# Patient Record
Sex: Female | Born: 1940 | Race: White | Hispanic: No | Marital: Married | Smoking: Never smoker
Health system: Southern US, Community
[De-identification: ages and names within clinical notes are randomized; demographics above are authoritative.]

## PROBLEM LIST (undated history)

## (undated) DIAGNOSIS — I10 Essential (primary) hypertension: Secondary | ICD-10-CM

## (undated) DIAGNOSIS — E785 Hyperlipidemia, unspecified: Secondary | ICD-10-CM

## (undated) HISTORY — PX: KNEE SURGERY: SHX244

## (undated) HISTORY — PX: EYE SURGERY: SHX253

## (undated) HISTORY — PX: TUBAL LIGATION: SHX77

---

## 2021-01-04 DIAGNOSIS — U099 Post covid-19 condition, unspecified: Secondary | ICD-10-CM | POA: Diagnosis not present

## 2021-01-04 DIAGNOSIS — J9621 Acute and chronic respiratory failure with hypoxia: Secondary | ICD-10-CM

## 2021-01-04 DIAGNOSIS — R1312 Dysphagia, oropharyngeal phase: Secondary | ICD-10-CM

## 2021-01-04 DIAGNOSIS — E871 Hypo-osmolality and hyponatremia: Secondary | ICD-10-CM | POA: Diagnosis not present

## 2021-01-05 DIAGNOSIS — U099 Post covid-19 condition, unspecified: Secondary | ICD-10-CM

## 2021-01-05 DIAGNOSIS — E871 Hypo-osmolality and hyponatremia: Secondary | ICD-10-CM | POA: Diagnosis not present

## 2021-01-05 DIAGNOSIS — R1312 Dysphagia, oropharyngeal phase: Secondary | ICD-10-CM | POA: Diagnosis not present

## 2021-01-05 DIAGNOSIS — J9621 Acute and chronic respiratory failure with hypoxia: Secondary | ICD-10-CM | POA: Diagnosis not present

## 2021-01-06 DIAGNOSIS — U099 Post covid-19 condition, unspecified: Secondary | ICD-10-CM | POA: Diagnosis not present

## 2021-01-06 DIAGNOSIS — R1312 Dysphagia, oropharyngeal phase: Secondary | ICD-10-CM

## 2021-01-06 DIAGNOSIS — J9621 Acute and chronic respiratory failure with hypoxia: Secondary | ICD-10-CM

## 2021-01-06 DIAGNOSIS — E871 Hypo-osmolality and hyponatremia: Secondary | ICD-10-CM | POA: Diagnosis not present

## 2021-01-07 ENCOUNTER — Inpatient Hospital Stay (HOSPITAL_COMMUNITY)
Admission: EM | Admit: 2021-01-07 | Discharge: 2021-01-15 | DRG: 377 | Disposition: A | Discharge status: Skilled Nursing Facility | Payer: Medicare Other | Source: Other Acute Inpatient Hospital | Attending: Internal Medicine | Admitting: Internal Medicine

## 2021-01-07 ENCOUNTER — Encounter (HOSPITAL_COMMUNITY): Payer: Self-pay | Admitting: Student in an Organized Health Care Education/Training Program

## 2021-01-07 DIAGNOSIS — J9600 Acute respiratory failure, unspecified whether with hypoxia or hypercapnia: Secondary | ICD-10-CM | POA: Diagnosis not present

## 2021-01-07 DIAGNOSIS — J9621 Acute and chronic respiratory failure with hypoxia: Secondary | ICD-10-CM

## 2021-01-07 DIAGNOSIS — G4733 Obstructive sleep apnea (adult) (pediatric): Secondary | ICD-10-CM | POA: Diagnosis not present

## 2021-01-07 DIAGNOSIS — R1319 Other dysphagia: Secondary | ICD-10-CM | POA: Diagnosis not present

## 2021-01-07 DIAGNOSIS — Z7982 Long term (current) use of aspirin: Secondary | ICD-10-CM

## 2021-01-07 DIAGNOSIS — R32 Unspecified urinary incontinence: Secondary | ICD-10-CM | POA: Diagnosis present

## 2021-01-07 DIAGNOSIS — D62 Acute posthemorrhagic anemia: Secondary | ICD-10-CM | POA: Diagnosis present

## 2021-01-07 DIAGNOSIS — J90 Pleural effusion, not elsewhere classified: Secondary | ICD-10-CM | POA: Diagnosis present

## 2021-01-07 DIAGNOSIS — K5641 Fecal impaction: Secondary | ICD-10-CM

## 2021-01-07 DIAGNOSIS — R1312 Dysphagia, oropharyngeal phase: Secondary | ICD-10-CM

## 2021-01-07 DIAGNOSIS — K5721 Diverticulitis of large intestine with perforation and abscess with bleeding: Secondary | ICD-10-CM | POA: Diagnosis present

## 2021-01-07 DIAGNOSIS — D75839 Thrombocytosis, unspecified: Secondary | ICD-10-CM | POA: Diagnosis present

## 2021-01-07 DIAGNOSIS — D638 Anemia in other chronic diseases classified elsewhere: Secondary | ICD-10-CM | POA: Diagnosis present

## 2021-01-07 DIAGNOSIS — I119 Hypertensive heart disease without heart failure: Secondary | ICD-10-CM | POA: Diagnosis present

## 2021-01-07 DIAGNOSIS — R5381 Other malaise: Secondary | ICD-10-CM | POA: Diagnosis present

## 2021-01-07 DIAGNOSIS — R935 Abnormal findings on diagnostic imaging of other abdominal regions, including retroperitoneum: Secondary | ICD-10-CM

## 2021-01-07 DIAGNOSIS — I10 Essential (primary) hypertension: Secondary | ICD-10-CM | POA: Diagnosis present

## 2021-01-07 DIAGNOSIS — E785 Hyperlipidemia, unspecified: Secondary | ICD-10-CM | POA: Diagnosis present

## 2021-01-07 DIAGNOSIS — Z7952 Long term (current) use of systemic steroids: Secondary | ICD-10-CM | POA: Diagnosis not present

## 2021-01-07 DIAGNOSIS — E871 Hypo-osmolality and hyponatremia: Secondary | ICD-10-CM

## 2021-01-07 DIAGNOSIS — K921 Melena: Secondary | ICD-10-CM | POA: Diagnosis not present

## 2021-01-07 DIAGNOSIS — K5901 Slow transit constipation: Secondary | ICD-10-CM | POA: Diagnosis not present

## 2021-01-07 DIAGNOSIS — U071 COVID-19: Secondary | ICD-10-CM | POA: Diagnosis not present

## 2021-01-07 DIAGNOSIS — Z20822 Contact with and (suspected) exposure to covid-19: Secondary | ICD-10-CM | POA: Diagnosis present

## 2021-01-07 DIAGNOSIS — K922 Gastrointestinal hemorrhage, unspecified: Secondary | ICD-10-CM | POA: Diagnosis present

## 2021-01-07 DIAGNOSIS — U099 Post covid-19 condition, unspecified: Secondary | ICD-10-CM

## 2021-01-07 DIAGNOSIS — R1032 Left lower quadrant pain: Secondary | ICD-10-CM | POA: Diagnosis not present

## 2021-01-07 DIAGNOSIS — Z79899 Other long term (current) drug therapy: Secondary | ICD-10-CM

## 2021-01-07 DIAGNOSIS — H5462 Unqualified visual loss, left eye, normal vision right eye: Secondary | ICD-10-CM | POA: Diagnosis present

## 2021-01-07 DIAGNOSIS — K219 Gastro-esophageal reflux disease without esophagitis: Secondary | ICD-10-CM | POA: Diagnosis present

## 2021-01-07 DIAGNOSIS — H9190 Unspecified hearing loss, unspecified ear: Secondary | ICD-10-CM | POA: Diagnosis present

## 2021-01-07 DIAGNOSIS — K5732 Diverticulitis of large intestine without perforation or abscess without bleeding: Secondary | ICD-10-CM | POA: Diagnosis not present

## 2021-01-07 DIAGNOSIS — Z9981 Dependence on supplemental oxygen: Secondary | ICD-10-CM

## 2021-01-07 DIAGNOSIS — K626 Ulcer of anus and rectum: Secondary | ICD-10-CM | POA: Diagnosis present

## 2021-01-07 DIAGNOSIS — K5900 Constipation, unspecified: Secondary | ICD-10-CM | POA: Diagnosis not present

## 2021-01-07 DIAGNOSIS — Z86010 Personal history of colonic polyps: Secondary | ICD-10-CM

## 2021-01-07 DIAGNOSIS — K5792 Diverticulitis of intestine, part unspecified, without perforation or abscess without bleeding: Secondary | ICD-10-CM | POA: Diagnosis not present

## 2021-01-07 DIAGNOSIS — R339 Retention of urine, unspecified: Secondary | ICD-10-CM | POA: Diagnosis not present

## 2021-01-07 DIAGNOSIS — T380X5A Adverse effect of glucocorticoids and synthetic analogues, initial encounter: Secondary | ICD-10-CM | POA: Diagnosis present

## 2021-01-07 DIAGNOSIS — Z22322 Carrier or suspected carrier of Methicillin resistant Staphylococcus aureus: Secondary | ICD-10-CM

## 2021-01-07 HISTORY — DX: Hyperlipidemia, unspecified: E78.5

## 2021-01-07 HISTORY — DX: Essential (primary) hypertension: I10

## 2021-01-07 LAB — CBC WITH DIFFERENTIAL/PLATELET
Abs Immature Granulocytes: 0.66 10*3/uL — ABNORMAL HIGH (ref 0.00–0.07)
Basophils Absolute: 0 10*3/uL (ref 0.0–0.1)
Basophils Relative: 0 %
Eosinophils Absolute: 0.1 10*3/uL (ref 0.0–0.5)
Eosinophils Relative: 0 %
HCT: 22.6 % — ABNORMAL LOW (ref 36.0–46.0)
Hemoglobin: 7.2 g/dL — ABNORMAL LOW (ref 12.0–15.0)
Immature Granulocytes: 4 %
Lymphocytes Relative: 11 %
Lymphs Abs: 1.8 10*3/uL (ref 0.7–4.0)
MCH: 29.5 pg (ref 26.0–34.0)
MCHC: 31.9 g/dL (ref 30.0–36.0)
MCV: 92.6 fL (ref 80.0–100.0)
Monocytes Absolute: 1 10*3/uL (ref 0.1–1.0)
Monocytes Relative: 7 %
Neutro Abs: 12.2 10*3/uL — ABNORMAL HIGH (ref 1.7–7.7)
Neutrophils Relative %: 78 %
Platelets: 517 10*3/uL — ABNORMAL HIGH (ref 150–400)
RBC: 2.44 MIL/uL — ABNORMAL LOW (ref 3.87–5.11)
RDW: 16 % — ABNORMAL HIGH (ref 11.5–15.5)
WBC: 15.8 10*3/uL — ABNORMAL HIGH (ref 4.0–10.5)
nRBC: 0.1 % (ref 0.0–0.2)

## 2021-01-07 LAB — COMPREHENSIVE METABOLIC PANEL
ALT: 40 U/L (ref 0–44)
AST: 19 U/L (ref 15–41)
Albumin: 2.1 g/dL — ABNORMAL LOW (ref 3.5–5.0)
Alkaline Phosphatase: 61 U/L (ref 38–126)
Anion gap: 6 (ref 5–15)
BUN: 17 mg/dL (ref 8–23)
CO2: 29 mmol/L (ref 22–32)
Calcium: 8.6 mg/dL — ABNORMAL LOW (ref 8.9–10.3)
Chloride: 97 mmol/L — ABNORMAL LOW (ref 98–111)
Creatinine, Ser: 0.42 mg/dL — ABNORMAL LOW (ref 0.44–1.00)
GFR, Estimated: >60 mL/min (ref >60)
Glucose, Bld: 107 mg/dL — ABNORMAL HIGH (ref 70–99)
Potassium: 4.2 mmol/L (ref 3.5–5.1)
Sodium: 132 mmol/L — ABNORMAL LOW (ref 135–145)
Total Bilirubin: 0.6 mg/dL (ref 0.3–1.2)
Total Protein: 4.4 g/dL — ABNORMAL LOW (ref 6.5–8.1)

## 2021-01-07 LAB — PREPARE RBC (CROSSMATCH)

## 2021-01-07 LAB — ABO/RH: ABO/RH(D): O POS

## 2021-01-07 LAB — APTT: aPTT: 28 seconds (ref 24–36)

## 2021-01-07 LAB — LACTIC ACID, PLASMA: Lactic Acid, Venous: 1.5 mmol/L (ref 0.5–1.9)

## 2021-01-07 LAB — PROTIME-INR
INR: 1 (ref 0.8–1.2)
Prothrombin Time: 13.2 seconds (ref 11.4–15.2)

## 2021-01-07 MED ORDER — SODIUM CHLORIDE 0.9% IV SOLUTION: Freq: Once | INTRAVENOUS | Status: AC

## 2021-01-07 NOTE — ED Provider Notes (Incomplete)
Patient is a 80 year old female with multiple medical problems including recent COVID and hospitalization now at an LTAC for oxygen weaning.  She is still on 40% FiO2 and continues to be weaned down but today had acute dark blood per rectum.  Patient continued to have multiple bloody bowel movements and had a hemoglobin drop from 9-7 with increase in her heart rate but stable hemoglobin.  She is not on anticoagulation however the LTAC facility does not have any blood available to give the patient if she continues to bleed.  They requested transfer here.  Here patient is pale but is having no abdominal pain at this time.  Lungs clear and murmur noted on heart exam. She has dark blood present on the pad in her bed appearing to be from the rectum.  Oxygen remained stable and she is hemodynamically stable.  However given excessive amount of bleeding and hemoglobin drop will transfuse 2 units, admit and have evaluation by GI.  She has had no prior GI bleeding in the past.  She was given prophylactic Lovenox while being at the facility but was not receiving therapeutic doses.  CRITICAL CARE Performed by: Estefanny Moler Total critical care time: 30 minutes Critical care time was exclusive of separately billable procedures and treating other patients. Critical care was necessary to treat or prevent imminent or life-threatening deterioration. Critical care was time spent personally by me on the following activities: development of treatment plan with patient and/or surrogate as well as nursing, discussions with consultants, evaluation of patient's response to treatment, examination of patient, obtaining history from patient or surrogate, ordering and performing treatments and interventions, ordering and review of laboratory studies, ordering and review of radiographic studies, pulse oximetry and re-evaluation of patient's condition.  Marland Kitchen

## 2021-01-07 NOTE — ED Provider Notes (Signed)
[facility] EMERGENCY DEPARTMENT Provider Note   CSN: [id number] Arrival date & time: 01/07/21  2133     History Chief Complaint  Patient presents with  . GI Bleeding    Joanne Hardin is a 80 y.o. female.  The history is provided by the patient and the EMS personnel.  Rectal Bleeding Quality:  Maroon Amount:  Moderate Duration:  1 day Timing:  Intermittent Chronicity:  Recurrent Context: spontaneously   Relieved by:  Nothing Worsened by:  Nothing Ineffective treatments:  None tried Associated symptoms: abdominal pain   Associated symptoms: no fever and no vomiting   Risk factors: steroid use        History reviewed. No pertinent past medical history.  There are no problems to display for this patient.   History reviewed. No pertinent surgical history.   OB History   No obstetric history on file.     History reviewed. No pertinent family history.     Home Medications Prior to Admission medications   Not on File    Allergies    Patient has no allergy information on record.  Review of Systems   Review of Systems  Constitutional: Negative for chills and fever.  HENT: Negative for ear pain and sore throat.   Eyes: Negative for pain and visual disturbance.  Respiratory: Negative for cough and shortness of breath.   Cardiovascular: Negative for chest pain and palpitations.  Gastrointestinal: Positive for abdominal pain, blood in stool and hematochezia. Negative for vomiting.  Genitourinary: Negative for dysuria and hematuria.  Musculoskeletal: Negative for arthralgias and back pain.  Skin: Negative for color change and rash.  Neurological: Negative for seizures and syncope.  All other systems reviewed and are negative.   Physical Exam Updated Vital Signs BP 121/60   Pulse 98   Temp 98.2 F (36.8 C) (Oral)   Resp 19   Ht 5\' 4"  (1.626 m)   Wt 77.1 kg   SpO2 100%   BMI 29.18 kg/m   Physical Exam Vitals and nursing note  reviewed.  Constitutional:      General: She is not in acute distress.    Appearance: She is well-developed and well-nourished.  HENT:     Head: Normocephalic and atraumatic.  Eyes:     Conjunctiva/sclera: Conjunctivae normal.  Cardiovascular:     Rate and Rhythm: Regular rhythm. Tachycardia present.     Heart sounds: No murmur heard.   Pulmonary:     Effort: Pulmonary effort is normal. No respiratory distress.     Breath sounds: Normal breath sounds.  Abdominal:     Palpations: Abdomen is soft.     Tenderness: There is no abdominal tenderness.  Genitourinary:    Rectum: Guaiac result positive.     Comments: melana Musculoskeletal:        General: No edema.     Cervical back: Neck supple.  Skin:    General: Skin is warm and dry.     Coloration: Skin is pale.  Neurological:     Mental Status: She is alert.  Psychiatric:        Mood and Affect: Mood and affect normal.     ED Results / Procedures / Treatments   Labs (all labs ordered are listed, but only abnormal results are displayed) Labs Reviewed  COMPREHENSIVE METABOLIC PANEL - Abnormal; Notable for the following components:      Result Value   Sodium 132 (*)    Chloride 97 (*)    Glucose,  Bld 107 (*)    Creatinine, Ser 0.42 (*)    Calcium 8.6 (*)    Total Protein 4.4 (*)    Albumin 2.1 (*)    All other components within normal limits  CBC WITH DIFFERENTIAL/PLATELET - Abnormal; Notable for the following components:   WBC 15.8 (*)    RBC 2.44 (*)    Hemoglobin 7.2 (*)    HCT 22.6 (*)    RDW 16.0 (*)    Platelets 517 (*)    Neutro Abs 12.2 (*)    Abs Immature Granulocytes 0.66 (*)    All other components within normal limits  PROTIME-INR  LACTIC ACID, PLASMA  APTT  TYPE AND SCREEN  ABO/RH  PREPARE RBC (CROSSMATCH)    EKG None  Radiology No results found.  Procedures Procedures (including critical care time)  Medications Ordered in ED Medications  0.9 %  sodium chloride infusion (Manually  program via Guardrails IV Fluids) (has no administration in time range)    ED Course  I have reviewed the triage vital signs and the nursing notes.  Pertinent labs & imaging results that were available during my care of the patient were reviewed by me and considered in my medical decision making (see chart for details).    MDM Rules/Calculators/A&P                          This is a 80 year old female with a past medical history of hypoxic respiratory failure secondary to COVID-pneumonia, hyponatremia (resolved), hypertension, baseline debility, who presents emergency department for evaluation of lower GI bleed.  Patient was recently admitted to the hospital on 12/10/2020, for COVID-pneumonia in which she required high flow nasal cannula in between BiPAP and was treated with remdesivir as well as IV steroids.  Her oxygen was weaned down to high flow at 75% and she was able to be discharged to a skilled nursing facility.  At the skilled nursing facility she did improve from a respiratory and oxygenation status to where she was able to be weaned down from 70% to 40% over the last few days.  Unfortunately this morning she sustained a lower GI bleed that was described at the facility as maroon blood from the rectum.  The patient was given Protonix and transferred to the emergency department for further evaluation. Of note on reviewing previous records, the patient appears to have had a hemoglobin drop from 9.4 to 7.1 from 1/19 until this afternoon.  On arrival she is afebrile, mildly tachycardic with heart rate in the low 100s which when reviewing previous records has been her baseline since her discharge from the hospital, she is hemodynamically stable and normotensive.  She reported that she was having some lower abdominal pain earlier in the day, that is now resolved, she has a nontender abdomen on my exam.  She has gross maroon melanotic blood per rectum.  Of note the patient was taken off high flow  on arrival as she was waiting for room placed on a nonrebreather.  Both on high flow nasal cannula at 40% as well as nonrebreather her saturations have been maintained at 100%.  Patient was cross and transfuse 2 units for a confirmed hemoglobin that was 7.2 on her arrival here.  Chemistry is significant for mild hyponatremia 132 and an albumin of 2.1. Coags are within normal limits, of note the patient was being treated with prophylactic Lovenox while the facility that was held this morning. Her CBC  is significant for mild leukocytosis of 15.8 which is likely secondary to the steroids that she was receiving for her COVID diagnosis.  Her hemoglobin is 7.2 which appears to be stable from the 7.1 at the facility reported, however given the ongoing bleeding on my exam she was transfused 2 units. Lactic acid is within normal limits, given the normal lactic acid and the patient not have any abdominal pain, do not believe that this is secondary to ischemic colitis and more likely related to, causes lower GI bleed such as diverticular bleeding, polyps, or potential consequence of IV steroids causing brisk upper GI gastric mucosal bleed.  Patient will be admitted to the hospital for hemoglobin trending, response evaluation, and a secure chat was sent to gastroenterology for consultation tomorrow.  Final Clinical Impression(s) / ED Diagnoses Final diagnoses:  Lower GI bleed    Rx / DC Orders ED Discharge Orders    None       Kathleen Lime, MD 01/07/21 2637    Gwyneth Sprout, MD 01/08/21 Corky Crafts

## 2021-01-07 NOTE — ED Triage Notes (Signed)
Pt came via [facility] with complaints of GI bleed. [facility] reports pts hemoglobin went from 9.1 to 7.1. Pt has had bloody stools at 5:35 this afternoon according to [facility]. Pt is reporting lower abdominal pains. [facility] reports pts vitals have been stable bp 135/60, hr 108, rr 22. Pt has a urinary catheter and peg tube.

## 2021-01-08 ENCOUNTER — Encounter (HOSPITAL_COMMUNITY): Payer: Self-pay | Admitting: Internal Medicine

## 2021-01-08 ENCOUNTER — Encounter (HOSPITAL_COMMUNITY)
Admission: EM | Disposition: A | Discharge status: Skilled Nursing Facility | Payer: Self-pay | Source: Other Acute Inpatient Hospital | Attending: Internal Medicine

## 2021-01-08 ENCOUNTER — Inpatient Hospital Stay (HOSPITAL_COMMUNITY): Payer: Medicare Other

## 2021-01-08 DIAGNOSIS — K5900 Constipation, unspecified: Secondary | ICD-10-CM | POA: Diagnosis not present

## 2021-01-08 DIAGNOSIS — K921 Melena: Secondary | ICD-10-CM | POA: Diagnosis not present

## 2021-01-08 DIAGNOSIS — D62 Acute posthemorrhagic anemia: Secondary | ICD-10-CM

## 2021-01-08 DIAGNOSIS — U071 COVID-19: Secondary | ICD-10-CM

## 2021-01-08 DIAGNOSIS — R1032 Left lower quadrant pain: Secondary | ICD-10-CM | POA: Diagnosis not present

## 2021-01-08 DIAGNOSIS — I10 Essential (primary) hypertension: Secondary | ICD-10-CM | POA: Diagnosis present

## 2021-01-08 DIAGNOSIS — J9600 Acute respiratory failure, unspecified whether with hypoxia or hypercapnia: Secondary | ICD-10-CM

## 2021-01-08 LAB — MRSA PCR SCREENING: MRSA by PCR: POSITIVE — AB

## 2021-01-08 LAB — BPAM RBC
Blood Product Expiration Date: 202202212359
ISSUE DATE / TIME: 202201202335
Unit Type and Rh: 5100

## 2021-01-08 LAB — BASIC METABOLIC PANEL
Anion gap: 8 (ref 5–15)
BUN: 16 mg/dL (ref 8–23)
CO2: 31 mmol/L (ref 22–32)
Calcium: 9 mg/dL (ref 8.9–10.3)
Chloride: 95 mmol/L — ABNORMAL LOW (ref 98–111)
Creatinine, Ser: 0.48 mg/dL (ref 0.44–1.00)
GFR, Estimated: >60 mL/min (ref >60)
Glucose, Bld: 95 mg/dL (ref 70–99)
Potassium: 4.1 mmol/L (ref 3.5–5.1)
Sodium: 134 mmol/L — ABNORMAL LOW (ref 135–145)

## 2021-01-08 LAB — TYPE AND SCREEN
ABO/RH(D): O POS
Antibody Screen: NEGATIVE
Unit division: 0

## 2021-01-08 LAB — PROTIME-INR
INR: 0.9 (ref 0.8–1.2)
Prothrombin Time: 12.1 seconds (ref 11.4–15.2)

## 2021-01-08 LAB — SARS CORONAVIRUS 2 BY RT PCR (HOSPITAL ORDER, PERFORMED IN ~~LOC~~ HOSPITAL LAB): SARS Coronavirus 2: NEGATIVE

## 2021-01-08 LAB — CBC
HCT: 27.9 % — ABNORMAL LOW (ref 36.0–46.0)
Hemoglobin: 9.6 g/dL — ABNORMAL LOW (ref 12.0–15.0)
MCH: 31.4 pg (ref 26.0–34.0)
MCHC: 34.4 g/dL (ref 30.0–36.0)
MCV: 91.2 fL (ref 80.0–100.0)
Platelets: 537 10*3/uL — ABNORMAL HIGH (ref 150–400)
RBC: 3.06 MIL/uL — ABNORMAL LOW (ref 3.87–5.11)
RDW: 15.8 % — ABNORMAL HIGH (ref 11.5–15.5)
WBC: 14.8 10*3/uL — ABNORMAL HIGH (ref 4.0–10.5)
nRBC: 0.1 % (ref 0.0–0.2)

## 2021-01-08 SURGERY — ESOPHAGOGASTRODUODENOSCOPY (EGD) WITH PROPOFOL
Anesthesia: Monitor Anesthesia Care

## 2021-01-08 MED ORDER — PANTOPRAZOLE SODIUM 40 MG IV SOLR: 40.0000 mg | Freq: Two times a day (BID) | INTRAVENOUS | Status: DC

## 2021-01-08 MED ORDER — SODIUM CHLORIDE 0.9 % IV SOLN
8.0000 mg/h | INTRAVENOUS | Status: DC
  Administered 2021-01-08: 8 mg/h via INTRAVENOUS
  Filled 2021-01-08 (×2): qty 80

## 2021-01-08 MED ORDER — SODIUM CHLORIDE 0.9 % IV SOLN
80.0000 mg | Freq: Once | INTRAVENOUS | Status: AC
  Administered 2021-01-08: 80 mg via INTRAVENOUS
  Filled 2021-01-08: qty 80

## 2021-01-08 MED ORDER — IOHEXOL 9 MG/ML PO SOLN
500.0000 mL | ORAL | Status: AC
  Administered 2021-01-08: 500 mL via ORAL

## 2021-01-08 MED ORDER — POLYETHYLENE GLYCOL 3350 17 GM/SCOOP PO POWD
1.0000 | Freq: Once | ORAL | Status: AC
  Administered 2021-01-08: 255 g via ORAL
  Filled 2021-01-08: qty 255

## 2021-01-08 MED ORDER — DIPHENHYDRAMINE-ZINC ACETATE 2-0.1 % EX CREA
TOPICAL_CREAM | Freq: Once | CUTANEOUS | Status: AC
  Administered 2021-01-08: 1 via TOPICAL
  Filled 2021-01-08: qty 28

## 2021-01-08 MED ORDER — METHYLPREDNISOLONE SODIUM SUCC 125 MG IJ SOLR
60.0000 mg | Freq: Every day | INTRAMUSCULAR | Status: DC
  Administered 2021-01-08 – 2021-01-09 (×2): 60 mg via INTRAVENOUS
  Filled 2021-01-08 (×2): qty 2

## 2021-01-08 MED ORDER — IOHEXOL 300 MG/ML  SOLN
100.0000 mL | Freq: Once | INTRAMUSCULAR | Status: AC | PRN
  Administered 2021-01-08: 100 mL via INTRAVENOUS

## 2021-01-08 MED ORDER — SODIUM CHLORIDE 0.9 % IV SOLN
1.0000 g | INTRAVENOUS | Status: DC
  Administered 2021-01-08 – 2021-01-13 (×6): 1 g via INTRAVENOUS
  Filled 2021-01-08 (×2): qty 1
  Filled 2021-01-08 (×3): qty 10
  Filled 2021-01-08 (×2): qty 1

## 2021-01-08 MED ORDER — METOPROLOL SUCCINATE ER 25 MG PO TB24
25.0000 mg | ORAL_TABLET | Freq: Every day | ORAL | Status: DC
  Administered 2021-01-08 – 2021-01-14 (×7): 25 mg via ORAL
  Filled 2021-01-08 (×8): qty 1

## 2021-01-08 MED ORDER — MUPIROCIN 2 % EX OINT
1.0000 "application " | TOPICAL_OINTMENT | Freq: Two times a day (BID) | CUTANEOUS | Status: AC
  Administered 2021-01-09 – 2021-01-13 (×9): 1 via NASAL
  Filled 2021-01-08 (×2): qty 22

## 2021-01-08 MED ORDER — METRONIDAZOLE IN NACL 5-0.79 MG/ML-% IV SOLN
500.0000 mg | Freq: Three times a day (TID) | INTRAVENOUS | Status: DC
  Administered 2021-01-08 – 2021-01-14 (×17): 500 mg via INTRAVENOUS
  Filled 2021-01-08 (×15): qty 100

## 2021-01-08 MED ORDER — CHLORHEXIDINE GLUCONATE CLOTH 2 % EX PADS
6.0000 | MEDICATED_PAD | Freq: Every day | CUTANEOUS | Status: AC
  Administered 2021-01-09 – 2021-01-11 (×3): 6 via TOPICAL

## 2021-01-08 MED ORDER — IOHEXOL 9 MG/ML PO SOLN
ORAL | Status: AC
  Filled 2021-01-08: qty 1000

## 2021-01-08 MED ORDER — PANTOPRAZOLE SODIUM 40 MG IV SOLR
40.0000 mg | INTRAVENOUS | Status: DC
  Administered 2021-01-09: 40 mg via INTRAVENOUS
  Filled 2021-01-08: qty 40

## 2021-01-08 MED ORDER — HYDRALAZINE HCL 20 MG/ML IJ SOLN: 10.0000 mg | INTRAMUSCULAR | Status: DC | PRN

## 2021-01-08 MED ORDER — PANTOPRAZOLE SODIUM 40 MG IV SOLR: 40.0000 mg | INTRAVENOUS | Status: DC

## 2021-01-08 MED ORDER — ACETAMINOPHEN 650 MG RE SUPP: 650.0000 mg | Freq: Four times a day (QID) | RECTAL | Status: DC | PRN

## 2021-01-08 MED ORDER — ACETAMINOPHEN 325 MG PO TABS
650.0000 mg | ORAL_TABLET | Freq: Four times a day (QID) | ORAL | Status: DC | PRN
  Administered 2021-01-10: 650 mg via ORAL
  Filled 2021-01-08 (×2): qty 2

## 2021-01-08 MED ORDER — SODIUM CHLORIDE 0.9 % IV SOLN: 1.0000 g | INTRAVENOUS | Status: DC

## 2021-01-08 NOTE — ED Notes (Signed)
Pt arrived on [facility] via [facility] from [facility]. Pt was placed on NRB 15Lpm and HFNC Salter 15 Lpm. Throughout the night O2 has been titrated down. Pt now on HFNC Salter 4Lpm. Pts respiratory status is stable w/no distress noted at this time. RT will continue to monitor.

## 2021-01-08 NOTE — Consult Note (Addendum)
[facility] Consult: 8:48 AM 01/08/2021  LOS: 1 day    Referring Provider: Dr Isidoro Donning  Primary Care Physician:    Currently residing at [facility].   Primary Gastroenterologist:  Joanne Hardin.  Joanne Kurstedt PA-C in [place]. Joanne Raisin, MD in [facility]    Reason for Consultation: GI bleed.   HPI: Joanne Hardin is a 80 y.o. female.  PMH hypertension.  Hyperlipidemia.  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 Colonoscopy ~ 05/2019.  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  Admitted to hospital in [place] in December for COVID 19 (unvaccinated), discharged to [facility] after difficulty weaning off high flow oxygen.  Problems have included urinary incontinence and some urinary retention and she currently has Foley catheter in place.  Chronic dysphagia.  At home she would eat soft easy to chew foods but had never had esophageal dilatation.  At [facility] she is on a chopped diet and her appetite is good.  She cannot tell me her bowel habits so its not clear if she has been constipated lately.  She does say she had black stools on Wednesday but cannot tell me if they recurred or how often they occurred.  Hematochezia, maroon stool reported last night and patient was having epigastric pain.  No nausea, no vomiting. Hgb declined 9.1-7.1. Per EMS stable BP 135/60, HR 108.  PPI drip initiated.  Hgb 9.6 after 1 PRBC.   Hyponatremia 132.  No renal insufficiency.  Normal lactic acid level.  Normal INR. Initially receiving high flow oxygen via nonrebreather mask, oxygen flow has gone from 15 L to 4 L/min and respiratory wise is in no distress. BPs 120s to 130s/50s.  Heart rates in the low 100s.  No fever.  Oxygen saturation upper 90s to  100%.  Outpatient meds include 81 ASA, no PPI or H2 blocker.  Patient denies previous history of GI bleeds.  She does recall previous EGD.  She is not sure if at her latest colonoscopy polyps were removed but she was advised to have another colonoscopy in 5 years.  No NSAIDs.  Patient's lost more than 100 pounds in the last couple of years and is probably lost 20 pounds since the COVID infection.  No previous blood transfusions or issues with bleeding.  She has been receiving daily abdominal injections of what sounds like either Lovenox or subcu heparin but neither of these are listed on her med list.  Her paper chart does not include a med list but a med list has been generated in epic and includes Protonix 40 mg bid.  Patient says that at home for reflux she takes Pepcid nightly.  A few days ago at the [facility], a provider was checking her gag reflex and patient vomited nonbloody stool.  Otherwise has not had any vomiting and never nausea.  Past Medical History:  Diagnosis Date  . HLD (hyperlipidemia)   . Hypertension     Past Surgical History:  Procedure Laterality Date  . EYE SURGERY    .  KNEE SURGERY    . TUBAL LIGATION      Prior to Admission medications   Medication Sig Start Date End Date Taking? Authorizing Provider  acetaminophen (TYLENOL) 650 MG CR tablet Take 650 mg by mouth every 6 (six) hours as needed for pain.   Yes [provider]  ALPRAZolam (XANAX) 0.25 MG tablet Take 0.25 mg by mouth every 4 (four) hours as needed for anxiety.   Yes [provider]  amLODipine (NORVASC) 10 MG tablet Take 10 mg by mouth daily. 11/02/20  Yes [provider]  aspirin 81 MG chewable tablet Chew 81 mg by mouth daily.   Yes [provider]  benzonatate (TESSALON) 100 MG capsule Take by mouth every 8 (eight) hours as needed for cough.   Yes [provider]  Cholecalciferol (VITAMIN D) 125 MCG (5000 UT) CAPS Take 5,000 Units by mouth daily.   Yes  [provider]  docusate sodium (COLACE) 100 MG capsule Take 100 mg by mouth in the morning and at bedtime.   Yes [provider]  guaifenesin (HUMIBID E) 400 MG TABS tablet Take 400 mg by mouth every 6 (six) hours as needed (cough).   Yes [provider]  LACTOBACILLUS PO Take 1 tablet by mouth in the morning and at bedtime.   Yes [provider]  losartan (COZAAR) 100 MG tablet Take 100 mg by mouth daily.   Yes [provider]  melatonin 3 MG TABS tablet Take 3 mg by mouth at bedtime.   Yes [provider]  metoprolol succinate (TOPROL-XL) 25 MG 24 hr tablet Take 25 mg by mouth daily. 01/05/21  Yes [provider]  Multiple Vitamin (MULTIVITAMIN) tablet Take 1 tablet by mouth daily.   Yes [provider]  Nutritional Supplements (ENSURE ENLIVE PO) Take 237 mLs by mouth 2 (two) times daily with a meal.   Yes [provider]  ondansetron (ZOFRAN) 4 MG tablet Take 4 mg by mouth every 8 (eight) hours as needed for nausea or vomiting.   Yes [provider]  pantoprazole (PROTONIX) 40 MG tablet Take 40 mg by mouth 2 (two) times daily.   Yes [provider]  predniSONE (DELTASONE) 10 MG tablet Take 10 mg by mouth See admin instructions. 30 mg qd x 3 days, 20mg  qd x 3 days,10mg  qd x 3 days   Yes [provider]  sennosides-docusate sodium (SENOKOT-S) 8.6-50 MG tablet Take 2 tablets by mouth daily.   Yes [provider]  tamsulosin (FLOMAX) 0.4 MG CAPS capsule Take 0.4 mg by mouth daily.   Yes [provider]  enoxaparin (LOVENOX) 80 MG/0.8ML injection Inject 70 mg into the skin every 12 (twelve) hours. Patient not taking: No sig reported    [provider]  methylPREDNISolone (MEDROL) 16 MG tablet Take 60 mg by mouth daily. Patient not taking: No sig reported    [provider]  thiamine 100 MG tablet Take 100 mg by mouth daily. Patient not taking: Reported on  01/08/2021    [provider]  vitamin C (ASCORBIC ACID) 500 MG tablet Take 1,000 mg by mouth 2 (two) times daily. Patient not taking: No sig reported    [provider]  zinc sulfate 220 (50 Zn) MG capsule Take 220 mg by mouth daily. Patient not taking: No sig reported    [provider]    Scheduled Meds: . methylPREDNISolone (SOLU-MEDROL) injection  60 mg Intravenous Daily  . metoprolol succinate  25 mg Oral Daily  . [START ON 01/11/2021] pantoprazole  40 mg Intravenous Q12H   Infusions: . pantoprozole (PROTONIX) infusion 8 mg/hr (01/08/21 0557)   PRN Meds: acetaminophen **OR** acetaminophen, hydrALAZINE   Allergies as of 01/07/2021  . (Not on File)    Family History  Problem Relation Age of Onset  . Diabetes Mellitus II Sister     Social History   Socioeconomic History  . Marital status: Married    Spouse name: Not on file  . Number of children: Not on file  . Years of education: Not on file  . Highest education level: Not on file  Occupational History  . Not on file  Tobacco Use  . Smoking status: Never Smoker  . Smokeless tobacco: Never Used  Substance and Sexual Activity  . Alcohol use: Never  . Drug use: Never  . Sexual activity: Not on file  Other Topics Concern  . Not on file  Social History Narrative  . Not on file   Social Determinants of Health   Financial Resource Strain: Not on file  Food Insecurity: Not on file  Transportation Needs: Not on file  Physical Activity: Not on file  Stress: Not on file  Social Connections: Not on file  Intimate Partner Violence: Not on file    REVIEW OF SYSTEMS: Constitutional: Weakness.  Her baseline is being able to stand with maximum assistance.  She is not yet ambulating. ENT:  No nose bleeds.  Nasal congestion.  Extremely hard of hearing Pulm: Still with significant cough as well as nasal congestion CV:  No palpitations, no LE edema.  No angina GU:  No hematuria, no frequency.   Urinary incontinence mostly related to the fact that she cannot get up and pee on her own.  Episodic urinary retention.  Foley catheter in place GI: See HPI Heme: Patient denies history of excessive bleeding or bruising or requirements for iron supplement. Transfusions: No previous blood transfusion Neuro: Blindness in her left eye no headaches, no peripheral tingling or numbness.  No dizziness, no syncope Derm:  No itching, no rash or sores.  Endocrine:  No sweats or chills.  No polyuria or dysuria Immunization: Has not been vaccinated for COVID-19 Travel:  None beyond local counties in last few months.    PHYSICAL EXAM: Vital signs in last 24 hours: Vitals:   01/08/21 0559 01/08/21 0831  BP: (!) 127/54 (!) 133/55  Pulse: (!) 104 (!) 104  Resp: 16 17  Temp: 98.1 F (36.7 C) (!) 97.5 F (36.4 C)  SpO2: 97% 97%   Wt Readings from Last 3 Encounters:  01/07/21 77.1 kg    General: Patient looks ill both chronically and acutely.  Alert, comfortable Head: No facial asymmetry or swelling.  No signs of head trauma. Eyes: Milky left pupil.  No conjunctival pallor Ears: Very hard of hearing, had to pull down my mask so that she could read my lips in addition to talking very loud. Nose: Sniffling in her nose, congested Mouth: Poor dentition.  Mucosa is moist, pink, clear.  Tongue is midline. Neck: No JVD, no masses, no thyromegaly Lungs: Diminished breath sounds.  Positive cough.  Breathing comfortably on 4 L nasal cannula oxygen. Heart: RRR.  No MRG.  S1, S2 present Abdomen: Soft, obese, active bowel sounds.  No HSM, masses, bruits, hernias.  Extensive bruising bilaterally in the lower abdomen moving laterally but not into her back, consistent with some sort of injection. Rectal: Soft large formed ball of  stool in the rectal vault.  Exam finger is covered with pastelike red blood.  No masses palpated. Musc/Skeltl: No joint redness or swelling Extremities: No CCE. Neurologic: Oriented to  year, situation but not good recall of simple details such as how often she is having bowel movements.  Moves all 4 limbs without tremor, strength not tested Skin: No rash, no sores.  Abdominal bruising as per abdominal exam. Tattoos: None Nodes: No cervical adenopathy Psych: Cooperative.  Intake/Output from previous day: 01/20 0701 - 01/21 0700 In: 315 [Blood:315] Out: -  Intake/Output this shift: No intake/output data recorded.  LAB RESULTS: Recent Labs    01/07/21 2148 01/08/21 0615  WBC 15.8* 14.8*  HGB 7.2* 9.6*  HCT 22.6* 27.9*  PLT 517* 537*   BMET Lab Results  Component Value Date   NA 134 (L) 01/08/2021   NA 132 (L) 01/07/2021   K 4.1 01/08/2021   K 4.2 01/07/2021   CL 95 (L) 01/08/2021   CL 97 (L) 01/07/2021   CO2 31 01/08/2021   CO2 29 01/07/2021   GLUCOSE 95 01/08/2021   GLUCOSE 107 (H) 01/07/2021   BUN 16 01/08/2021   BUN 17 01/07/2021   CREATININE 0.48 01/08/2021   CREATININE 0.42 (L) 01/07/2021   CALCIUM 9.0 01/08/2021   CALCIUM 8.6 (L) 01/07/2021   LFT Recent Labs    01/07/21 2148  PROT 4.4*  ALBUMIN 2.1*  AST 19  ALT 40  ALKPHOS 61  BILITOT 0.6   PT/INR Lab Results  Component Value Date   INR 0.9 01/08/2021   INR 1.0 01/07/2021   Hepatitis Panel No results for input(s): HEPBSAG, HCVAB, HEPAIGM, HEPBIGM in the last 72 hours. C-Diff No components found for: CDIFF Lipase  No results found for: LIPASE  Drugs of Abuse  No results found for: LABOPIA, COCAINSCRNUR, LABBENZ, AMPHETMU, THCU, LABBARB   RADIOLOGY STUDIES: No results found.    IMPRESSION:   *Joanne Hardin, bloody stool suggesting lower GI bleed.  Given presence of large stool ball/impaction in the rectum, ? stercoral ulcer. Adenomatous colon polyps around 2017, colonoscopy 05/2019 but unknown if she had recurrent polyps then or if she has diverticulosis AVMs etc.  *   Normocytic anemia, presumed ABL anemia. No comp labs to assess baseline.  Acute blood loss on top of  probable chronic anemia from recent illness.  *    Chronic dysphagia.  Rule out esophageal dysmotility versus stricture.  No previous esophageal dilations.  *   COVID-19 pneumonia hospitalized 11/2020, difficulty weaning off high flow oxygen and transferred to [facility].  *    Debilitated state.  -LLQ pain  PLAN:     *   Will discuss with Dr. Myrtie Neither  *    Gatorade/Miralax "prep" to address fecal impaction.    *   Clears  *   ? Need for PPI drip, leaving in place for now.        Jennye Moccasin  01/08/2021, 8:48 AM Phone [phone]  I have reviewed the entire case in detail with the above APP and discussed the plan in detail.  Therefore, I agree with the diagnoses recorded above. In addition,  I have personally interviewed and examined the patient.  My additional thoughts are as follows:  Thorough chart review performed, medically complex patient at rehab after recent prolonged COVID infection.  Initial reports suggested patient may have had some black stool in the last couple days, but our exam found dark red blood with formed stool/fecal  impaction. I spoke with her son Joanne HuaDavid who was at the bedside, he has not known her to lately be complaining of abdominal pain, and she seems to deny that is near as I can tell (history limited since she is so hard of hearing). I found her to be tender in the left lower quadrant on exam.  She has leukocytosis, though significance of that or relation to the abdominal pain unknown since she also has multiple other physiologic stressors with this chronic illness. However, I am going to obtain abdominal pelvic CT scan with oral and IV contrast to rule out diverticulitis, as that would affect our plans for treatment and eventual colonoscopy.  She will need a sigmoidoscopy or colonoscopy during this hospitalization, but she is not briskly bleeding and we are not yet ready to proceed with that because she needs relief of her obstipation. If she were to  start having melena indicative of overt GI bleed, then EGD would be warranted as well.  These procedures would certainly be at increased risk due to her general debility and overall medical condition.   Joanne Hardin [phone]

## 2021-01-08 NOTE — ED Notes (Signed)
Pt taken off nrb and kept on highflow [place] at 8l

## 2021-01-08 NOTE — Progress Notes (Addendum)
Patient seen and examined, admitted by Dr. Toniann Fail this morning Briefly 80 year old female with hypertension, hyperlipidemia, recent COVID in 11/2020 following which patient was found to difficult to be weaned off high flow oxygen and was subsequently discharged to [facility].  Patient was placed on IV Solu-Medrol for the respiratory failure, found to have melanotic stools and hemoglobin dropped.  Patient was sent to ED. Not on anticoagulation  BP (!) 133/55 (BP Location: Right Arm)   Pulse (!) 104   Temp (!) 97.5 F (36.4 C) (Oral)   Resp 17   Ht 5\' 4"  (1.626 m)   Wt 77.1 kg   SpO2 97%   BMI 29.18 kg/m   Acute GI bleed with ABLA -Continue Protonix, hold aspirin, follow H&H.  Currently no active bleeding. -GI consulted, awaiting recommendations -Hemoglobin 7.2 at the time of admission, transfused 1 unit packed RBCs, 9.6 this morning  Acute respiratory failure secondary to recent COVID-19 infection -Continue IV Solu-Medrol, wean O2 as tolerated  Hypertension -Currently stable, continue IV hydralazine as needed with parameters   will continue to follow   Paige Monarrez M.D.  [facility] 01/08/2021, 9:43 AM

## 2021-01-08 NOTE — ED Notes (Signed)
Pt switched to 4 L [place].

## 2021-01-08 NOTE — H&P (Signed)
History and Physical    Joanne Hardin DJT:[id number] DOB: 20-Oct-1941 DOA: 01/07/2021  PCP: Home, [facility]  Patient coming from: [facility] long-term acute care.  Chief Complaint: Rectal bleeding.  HPI: Joanne Hardin is a 80 y.o. female with history of hypertension and hyperlipidemia was admitted on December 23 for COVID infection following which patient was found to difficult to be weaned off high flow oxygen and was subsequently discharged to [facility] and patient was placed on IV Solu-Medrol for the acute respiratory failure.  Patient was found to have melanotic stool and hemoglobin drop.  Chart showed patient's hemoglobin was around 9 at the time.  Patient was referred to the ER.  Patient at times has abdominal epigastric pain.  Denies any chest pain.  Patient does take aspirin denies taking NSAIDs.  ED Course: In the ER patient hemoglobin is around 9.  Patient was on high flow oxygen.  Patient was admitted for further management of acute GI bleed.  2 units of PRBC transfusion ordered and Protonix drip started.  Review of Systems: As per HPI, rest all negative.   Past Medical History:  Diagnosis Date  . HLD (hyperlipidemia)   . Hypertension     Past Surgical History:  Procedure Laterality Date  . EYE SURGERY    . KNEE SURGERY    . TUBAL LIGATION       reports that she has never smoked. She has never used smokeless tobacco. She reports that she does not drink alcohol and does not use drugs.  No Known Allergies  Family History  Problem Relation Age of Onset  . Diabetes Mellitus II Sister     Prior to Admission medications   Medication Sig Start Date End Date Taking? Authorizing Provider  amLODipine (NORVASC) 10 MG tablet Take 10 mg by mouth daily. 11/02/20   [provider]  aspirin 81 MG chewable tablet Chew 81 mg by mouth daily.    [provider]  atorvastatin (LIPITOR) 20 MG tablet Take 20 mg by mouth daily. 11/15/20   [provider]   docusate sodium (COLACE) 100 MG capsule Take 100 mg by mouth in the morning and at bedtime.    [provider]  hydrochlorothiazide (HYDRODIURIL) 25 MG tablet Take 25 mg by mouth daily. 01/05/21   [provider]  losartan (COZAAR) 100 MG tablet Take 100 mg by mouth daily.    [provider]  metoprolol succinate (TOPROL-XL) 25 MG 24 hr tablet Take 25 mg by mouth daily. 01/05/21   [provider]    Physical Exam: Constitutional: Moderately built and nourished. Vitals:   01/08/21 0045 01/08/21 0100 01/08/21 0115 01/08/21 0130  BP: 112/63 122/66 122/62 (!) 129/53  Pulse: 93 (!) 108 99 94  Resp: 19 (!) 21 (!) 24 15  Temp:      TempSrc:      SpO2: 100% 100% 100% 100%  Weight:      Height:       Eyes: Anicteric no pallor. ENMT: No discharge from the ears eyes nose and mouth. Neck: No mass felt.  No neck rigidity. Respiratory: No rhonchi or crepitations. Cardiovascular: S1-S2 heard. Abdomen: Soft nontender bowel sounds present. Musculoskeletal: No edema. Skin: No rash. Neurologic: Alert awake oriented to time place and person.  Moves all extremities. Psychiatric: Appears normal.  Normal affect.   Labs on Admission: I have personally reviewed following labs and imaging studies  CBC: Recent Labs  Lab 01/07/21 2148  WBC 15.8*  NEUTROABS 12.2*  HGB 7.2*  HCT 22.6*  MCV 92.6  PLT 517*   Basic Metabolic Panel: Recent Labs  Lab 01/07/21 2148  NA 132*  K 4.2  CL 97*  CO2 29  GLUCOSE 107*  BUN 17  CREATININE 0.42*  CALCIUM 8.6*   GFR: Estimated Creatinine Clearance: 59.2 mL/min (A) (by C-G formula based on SCr of 0.42 mg/dL (L)). Liver Function Tests: Recent Labs  Lab 01/07/21 2148  AST 19  ALT 40  ALKPHOS 61  BILITOT 0.6  PROT 4.4*  ALBUMIN 2.1*   No results for input(s): LIPASE, AMYLASE in the last 168 hours. No results for input(s): AMMONIA in the last 168 hours. Coagulation Profile: Recent Labs  Lab 01/07/21 2148   INR 1.0   Cardiac Enzymes: No results for input(s): CKTOTAL, CKMB, CKMBINDEX, TROPONINI in the last 168 hours. BNP (last 3 results) No results for input(s): PROBNP in the last 8760 hours. HbA1C: No results for input(s): HGBA1C in the last 72 hours. CBG: No results for input(s): GLUCAP in the last 168 hours. Lipid Profile: No results for input(s): CHOL, HDL, LDLCALC, TRIG, CHOLHDL, LDLDIRECT in the last 72 hours. Thyroid Function Tests: No results for input(s): TSH, T4TOTAL, FREET4, T3FREE, THYROIDAB in the last 72 hours. Anemia Panel: No results for input(s): VITAMINB12, FOLATE, FERRITIN, TIBC, IRON, RETICCTPCT in the last 72 hours. Urine analysis: No results found for: COLORURINE, APPEARANCEUR, LABSPEC, PHURINE, GLUCOSEU, HGBUR, BILIRUBINUR, KETONESUR, PROTEINUR, UROBILINOGEN, NITRITE, LEUKOCYTESUR Sepsis Labs: @LABRCNTIP (procalcitonin:4,lacticidven:4) )No results found for this or any previous visit (from the past 240 hour(s)).   Radiological Exams on Admission: No results found.    Assessment/Plan Principal Problem:   Acute GI bleeding Active Problems:   Acute respiratory failure due to COVID-19 [facility])   Essential hypertension   Acute blood loss anemia    1. Acute GI bleeding -we will keep patient on Protonix for now.  Hold aspirin 2 units of PRBC transfusion was ordered.  Follow CBC.  [facility] has been sent a message. 2. Acute blood loss anemia follow CBC after transfusion. 3. Acute respiratory failure presently on high flow oxygen secondary to COVID-19 infection recently.  On IV Solu-Medrol. 4. Hypertension we will keep patient n.p.o. IV hydralazine since patient is n.p.o. 5. History of hyperlipidemia.  Since patient has acute GI bleeding with the respiratory failure will need close monitoring for any further worsening in inpatient status.   DVT prophylaxis: SCDs.  Avoiding anticoagulation due to GI bleed. Code Status: Full code. Family Communication: Discussed  with patient. Disposition Plan: Back to facility when stable. Consults called: [facility] was sent a message. Admission status: Inpatient.   [facility] MD [facility] Pager [phone].  If 7PM-7AM, please contact night-coverage [email or web address] Password [email or web address]  01/08/2021, 2:26 AM

## 2021-01-09 DIAGNOSIS — K922 Gastrointestinal hemorrhage, unspecified: Secondary | ICD-10-CM

## 2021-01-09 DIAGNOSIS — K5900 Constipation, unspecified: Secondary | ICD-10-CM | POA: Diagnosis not present

## 2021-01-09 DIAGNOSIS — D62 Acute posthemorrhagic anemia: Secondary | ICD-10-CM | POA: Diagnosis not present

## 2021-01-09 DIAGNOSIS — K5792 Diverticulitis of intestine, part unspecified, without perforation or abscess without bleeding: Secondary | ICD-10-CM | POA: Diagnosis not present

## 2021-01-09 DIAGNOSIS — K921 Melena: Secondary | ICD-10-CM | POA: Diagnosis not present

## 2021-01-09 LAB — CBC
HCT: 33.2 % — ABNORMAL LOW (ref 36.0–46.0)
Hemoglobin: 10.7 g/dL — ABNORMAL LOW (ref 12.0–15.0)
MCH: 29.8 pg (ref 26.0–34.0)
MCHC: 32.2 g/dL (ref 30.0–36.0)
MCV: 92.5 fL (ref 80.0–100.0)
Platelets: 578 10*3/uL — ABNORMAL HIGH (ref 150–400)
RBC: 3.59 MIL/uL — ABNORMAL LOW (ref 3.87–5.11)
RDW: 15.9 % — ABNORMAL HIGH (ref 11.5–15.5)
WBC: 15.5 10*3/uL — ABNORMAL HIGH (ref 4.0–10.5)
nRBC: 0 % (ref 0.0–0.2)

## 2021-01-09 LAB — BASIC METABOLIC PANEL
Anion gap: 8 (ref 5–15)
BUN: 10 mg/dL (ref 8–23)
CO2: 28 mmol/L (ref 22–32)
Calcium: 8.8 mg/dL — ABNORMAL LOW (ref 8.9–10.3)
Chloride: 99 mmol/L (ref 98–111)
Creatinine, Ser: 0.5 mg/dL (ref 0.44–1.00)
GFR, Estimated: >60 mL/min (ref >60)
Glucose, Bld: 147 mg/dL — ABNORMAL HIGH (ref 70–99)
Potassium: 4.5 mmol/L (ref 3.5–5.1)
Sodium: 135 mmol/L (ref 135–145)

## 2021-01-09 LAB — GLUCOSE, CAPILLARY
Glucose-Capillary: 76 mg/dL (ref 70–99)
Glucose-Capillary: 87 mg/dL (ref 70–99)

## 2021-01-09 MED ORDER — POLYETHYLENE GLYCOL 3350 17 G PO PACK
17.0000 g | PACK | Freq: Two times a day (BID) | ORAL | Status: DC
  Administered 2021-01-09 – 2021-01-10 (×3): 17 g via ORAL
  Filled 2021-01-09 (×3): qty 1

## 2021-01-09 MED ORDER — BOOST / RESOURCE BREEZE PO LIQD CUSTOM
1.0000 | Freq: Three times a day (TID) | ORAL | Status: DC
  Administered 2021-01-09 – 2021-01-14 (×11): 1 via ORAL

## 2021-01-09 MED ORDER — PANTOPRAZOLE SODIUM 40 MG PO TBEC
40.0000 mg | DELAYED_RELEASE_TABLET | Freq: Every day | ORAL | Status: DC
  Administered 2021-01-10 – 2021-01-15 (×6): 40 mg via ORAL
  Filled 2021-01-09 (×6): qty 1

## 2021-01-09 MED ORDER — PREDNISONE 10 MG PO TABS
10.0000 mg | ORAL_TABLET | Freq: Every day | ORAL | Status: DC
  Administered 2021-01-13: 10 mg via ORAL
  Filled 2021-01-09: qty 1

## 2021-01-09 MED ORDER — PREDNISONE 20 MG PO TABS
20.0000 mg | ORAL_TABLET | Freq: Every day | ORAL | Status: AC
  Administered 2021-01-10 – 2021-01-12 (×3): 20 mg via ORAL
  Filled 2021-01-09 (×3): qty 1

## 2021-01-09 NOTE — Progress Notes (Signed)
PROGRESS NOTE    Joanne Hardin  KDX:[id number] DOB: 1940/12/24 DOA: 01/07/2021 PCP: Home, [facility] Brief Narrative:   80 year old woman with history significant for recent COVID infection and subsequent hypoxemic respiratory failure on high flow nasal cannula and chronic steroids residing at long-term care facility of [facility] to recover after hospital stay in [place].  While there she was noted to have melanotic stools and anemia and sent to [facility] emergency department for evaluation.  On initial evaluation in the emergency room, hemoglobin was found to be 9 and 2 units of blood were transfused as she was having bleeding.  CT abdomen pelvis showed diverticulitis and a large stool burden.  Gastroenterology has been consulted at this time is recommending antibiotics and medical management of the diverticulitis.  Status is: Inpatient  Remains inpatient appropriate because:IV treatments appropriate due to intensity of illness or inability to take PO   Dispo:  Patient From: Skilled Nursing Facility  Planned Disposition: LTACH (Previously at [facility])  Expected discharge date: 01/12/2021  Medically stable for discharge: No     Assessment & Plan:   Principal Problem:   Acute GI bleeding Active Problems:   Acute respiratory failure due to COVID-19 [facility])   Essential hypertension   Acute blood loss anemia   Acute blood loss anemia,, likely due to GI bleed in setting of diverticulitis and possible stroke or ulcer.  Gastroenterology consulted and we appreciate their care and consultation. -Continue ceftriaxone and Flagyl, will transition Flagyl to p.o. as the patient is able to take p.o. possibly tomorrow. - NPO for now, will restart home medications slowly  -Follow-up GI recommendations -We will advance diet to clears pending recommendation from gastroenterology MD.  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 have started for tomorrow 23 January 20 mg of prednisone for 3 days and then 10 mg of prednisone for 3 more days.  Taper set to discontinue.. -Would monitor fluid status closely she may benefit from gentle diuresis as she had pleural effusions on initial x-ray.  She has had excellent diuresis overnight without diuretic use.  Chronic indwelling Foley catheter, present on admission without signs of infection at site currently.  Message nurse to determine when the last time this was changed.  Would recommend changing to a clean catheter if this did not been changed since her discharge to [facility].  GERD -Continue PPI which is home medication  Hypertension -Metoprolol 25 mg daily monitor for hypotension.  Pleural effusions, on CT, if oxygenation worsens, recommend CXR. Already weaning oxygen as compared to discharge from [facility].   Hyponatremia- ordered BMP, suspect due to malnutrition  Leukocytosis- likely due to diverticulitis  Thrombocytosis- likely due to reactive (infection and anemia))    DVT prophylaxis: SCD given GI bleeding  Code Status: FULL Family Communication: at bedside (son), patient as well Disposition Plan: Likely need SNF/LTACH, PT/OT Consulted     Consultants:   Gastroenterology  Procedures:   None   Antimicrobials:   Ceftriaxone, metronidazole   Subjective:  Patient reports she feels slightly better.  She had multiple bloody bowel movements overnight which are now progressing to a muddy brown color.  She denies abdominal pain today.  Reports breathing is okay and that she actually feels better than prior.  Denies chest pain or abdominal pain  Objective: Vitals:   01/08/21 1542 01/08/21 2051 01/09/21 0019 01/09/21 0535  BP: 131/68 (!) 150/77 (!) 161/88 (!) 139/59  Pulse: (!) 105 89 90 98  Resp: 18 19 16 17   Temp: 98.1 F (36.7 C) 97.8 F (36.6 C) 97.7 F (36.5 C) 97.9 F (36.6 C)  TempSrc: Oral  Oral Oral  SpO2: 97% 97% 100% 94%  Weight:       Height:        Intake/Output Summary (Last 24 hours) at 01/09/2021 0728 Last data filed at 01/09/2021 0536 Gross per 24 hour  Intake 290.37 ml  Output 3200 ml  Net -2909.63 ml   Filed Weights   01/07/21 2143  Weight: 77.1 kg    Examination:  Cardiac: Regular rate and rhythm. Normal S1/S2. No murmurs, rubs, or gallops appreciated. Lungs: Coarse bilateral breath sounds on 4 L of high flow nasal cannula Abdomen: Diminished bowel sounds bilateral lower quadrant tenderness without rebound or guarding Psych: Hard of hearing but alert and oriented to situation.  Intermittently confused throughout conversation talks easily with son   Data Reviewed: I have personally reviewed following labs and imaging studies  CBC: Recent Labs  Lab 01/07/21 2148 01/08/21 0615 01/09/21 0203  WBC 15.8* 14.8* 15.5*  NEUTROABS 12.2*  --   --   HGB 7.2* 9.6* 10.7*  HCT 22.6* 27.9* 33.2*  MCV 92.6 91.2 92.5  PLT 517* 537* 578*   Basic Metabolic Panel: Recent Labs  Lab 01/07/21 2148 01/08/21 0615  NA 132* 134*  K 4.2 4.1  CL 97* 95*  CO2 29 31  GLUCOSE 107* 95  BUN 17 16  CREATININE 0.42* 0.48  CALCIUM 8.6* 9.0   GFR: Estimated Creatinine Clearance: 59.2 mL/min (by C-G formula based on SCr of 0.48 mg/dL). Liver Function Tests: Recent Labs  Lab 01/07/21 2148  AST 19  ALT 40  ALKPHOS 61  BILITOT 0.6  PROT 4.4*  ALBUMIN 2.1*   No results for input(s): LIPASE, AMYLASE in the last 168 hours. No results for input(s): AMMONIA in the last 168 hours. Coagulation Profile: Recent Labs  Lab 01/07/21 2148 01/08/21 0615  INR 1.0 0.9   Cardiac Enzymes: No results for input(s): CKTOTAL, CKMB, CKMBINDEX, TROPONINI in the last 168 hours. BNP (last 3 results) No results for input(s): PROBNP in the last 8760 hours. HbA1C: No results for input(s): HGBA1C in the last 72 hours. CBG: Recent Labs  Lab 01/09/21 0014 01/09/21 0637  GLUCAP 87 76   Lipid Profile: No results for input(s):  CHOL, HDL, LDLCALC, TRIG, CHOLHDL, LDLDIRECT in the last 72 hours. Thyroid Function Tests: No results for input(s): TSH, T4TOTAL, FREET4, T3FREE, THYROIDAB in the last 72 hours. Anemia Panel: No results for input(s): VITAMINB12, FOLATE, FERRITIN, TIBC, IRON, RETICCTPCT in the last 72 hours. Sepsis Labs: Recent Labs  Lab 01/07/21 2225  LATICACIDVEN 1.5    Recent Results (from the past 240 hour(s))  SARS Coronavirus 2 by RT PCR (hospital order, performed in [facility] hospital lab) Nasopharyngeal Nasopharyngeal Swab     Status: None   Collection Time: 01/08/21  1:01 AM   Specimen: Nasopharyngeal Swab  Result Value Ref Range Status   SARS Coronavirus 2 NEGATIVE NEGATIVE Final    Comment: (NOTE) SARS-CoV-2 target nucleic acids are NOT DETECTED.  The SARS-CoV-2 RNA is generally detectable in upper and lower respiratory specimens during the acute phase of infection. The lowest concentration of SARS-CoV-2 viral copies this assay can detect is 250 copies / mL. A negative result does not preclude SARS-CoV-2 infection and should not be used as the sole basis for treatment or other patient management decisions.  A negative result may occur with improper specimen collection / handling, submission of specimen other than nasopharyngeal swab, presence of viral mutation(s) within the areas targeted by this assay, and inadequate number of viral copies (<250 copies / mL). A negative result must be combined with clinical observations, patient history, and epidemiological information.  Fact Sheet for Patients:   BoilerBrush.com.cyhttps://www.fda.gov/media/136312/download  Fact Sheet for Healthcare Providers: https://pope.com/https://www.fda.gov/media/136313/download  This test is not yet approved or  cleared by the [place] FDA and has been authorized for detection and/or diagnosis of SARS-CoV-2 by FDA under an Emergency Use Authorization (EUA).  This EUA will remain in effect (meaning this test can be used) for the  duration of the COVID-19 declaration under Section 564(b)(1) of the Act, 21 U.S.C. section 360bbb-3(b)(1), unless the authorization is terminated or revoked sooner.  Performed at [facility], [address] [place], [place] [address]   MRSA PCR Screening     Status: Abnormal   Collection Time: 01/08/21  6:37 AM   Specimen: Nasal Mucosa; Nasopharyngeal  Result Value Ref Range Status   MRSA by PCR POSITIVE (A) NEGATIVE Final    Comment:        The GeneXpert MRSA Assay (FDA approved for NASAL specimens only), is one component of a comprehensive MRSA colonization surveillance program. It is not intended to diagnose MRSA infection nor to guide or monitor treatment for MRSA infections. RESULT CALLED TO, READ BACK BY AND VERIFIED WITH: Alroy Bailiff. Harris RN 11:15 01/08/21 ([email or web address]) Performed at [facility], [address] [place], [place] [address]          Radiology Studies: CT ABDOMEN PELVIS W CONTRAST  Result Date: 01/08/2021 CLINICAL DATA:  Left lower quadrant pain EXAM: CT ABDOMEN AND PELVIS WITH CONTRAST TECHNIQUE: Multidetector CT imaging of the abdomen and pelvis was performed using the standard protocol following bolus administration of intravenous contrast. CONTRAST:  100mL OMNIPAQUE IOHEXOL 300 MG/ML  SOLN COMPARISON:  None. FINDINGS: Lower chest: Lung bases demonstrate small left greater than right pleural effusions. Irregular consolidations and ground-glass densities suspicious for bilateral pneumonia. Hepatobiliary: Subcentimeter hypodensity within the anterior left hepatic lobe too small to further characterize. No calcified gallstone or biliary dilatation Pancreas: Unremarkable. No pancreatic ductal dilatation or surrounding inflammatory changes. Spleen: Normal in size without focal abnormality. Adrenals/Urinary Tract: Adrenal glands are normal. Kidneys show no hydronephrosis. Nonspecific left perinephric fluid. The bladder is decompressed by Foley catheter.  Stomach/Bowel: The stomach is nonenlarged. There is no dilated small bowel. There is large amount of stool in the colon. There is moderate rectal distension by feces. Mild soft tissue stranding at the sigmoid colon with diverticula present suspicious for diverticulitis. Focal inflammatory soft tissue density adjacent to the sigmoid colon measuring 2.4 x 2.3 cm with small internal gas bubble. Vascular/Lymphatic: Nonaneurysmal aorta. Mild aortic atherosclerosis. No suspicious adenopathy Reproductive: Uterus and adnexa are unremarkable Other: No free air.  Mild presacral soft tissue stranding. Musculoskeletal: Probable chronic mild superior endplate deformity at T10 IMPRESSION: 1. Inflammatory soft tissue stranding and mild wall thickening at the sigmoid colon, consistent with acute diverticulitis. 2.4 cm focal inflammatory mass adjacent to sigmoid colon with tiny gas bubble, this could represent contained perforation, phlegmon/developing abscess, or inflamed giant diverticulum given sizable diverticula elsewhere in the sigmoid colon. Follow-up colonoscopy recommended to exclude a mass after resolution of acute presentation. 2. Large amount of stool in the colon with moderate rectal distension by feces. 3. Small left greater than right pleural effusions. Irregular consolidations and  ground-glass densities at the lung bases suspicious for bilateral pneumonia, possible atypical or viral pneumonia. These results will be called to the ordering clinician or representative by the Radiologist Assistant, and communication documented in the PACS or [employer]. Aortic Atherosclerosis (ICD10-I70.0). Electronically Signed   By: Jasmine Pang M.D.   On: 01/08/2021 19:14        Scheduled Meds: . Chlorhexidine Gluconate Cloth  6 each Topical Q0600  . methylPREDNISolone (SOLU-MEDROL) injection  60 mg Intravenous Daily  . metoprolol succinate  25 mg Oral Daily  . mupirocin ointment  1 application Nasal BID  .  pantoprazole (PROTONIX) IV  40 mg Intravenous Q24H   Continuous Infusions: . cefTRIAXone (ROCEPHIN)  IV 1 g (01/08/21 2109)  . metronidazole 500 mg (01/09/21 0620)     LOS: 2 days    Time spent: 25 minutes     Terisa Starr, MD   [facility] Pager [phone]  If 7PM-7AM, please contact night-coverage [email or web address] Password [facility] 01/09/2021, 7:28 AM

## 2021-01-09 NOTE — Evaluation (Signed)
Physical Therapy Evaluation Patient Details Name: Joanne Hardin MRN: [id number] DOB: April 03, 1941 Today's Date: 01/09/2021   History of Present Illness  Pt is a 80 y.o. female with a PMH significant for HTN, hyperlipidemia, and recent COVID hospitalization now at [facility] facility for O2 weaning (on 40% FiO2) who presents with lower abdominal pain, bloody stools, decrease in hemoglobin, and GI bleed.  Clinical Impression  Pt presents with condition above and deficits mentioned below, see PT Problem List. PTA, she was at a LTAC facility receiving therapy services and attempting to wean her O2 needs following hospitalization due to COVID. She reports not being able to walk with therapy at her facility yet, except taking several small steps laterally to transfer between surfaces. Pt continues to appear to be limited in mobility by her decreased overall strength (especially in her legs), decreased coordination, and impaired aerobic endurance. Unable to attempt to come to stand this date secondary to a decline to 80s% SpO2 with noted pt becoming SOB despite an increase in supplemental O2, see General Comments below. Pt would benefit from acute PT services followed by continued PT services at her prior facility to address her deficits to maximize her safety and independence with all functional mobility.    Follow Up Recommendations LTACH;Supervision/Assistance - 24 hour (return to prior LTAC facility)    Equipment Recommendations   (TBD at next venue)    Recommendations for Other Services       Precautions / Restrictions Precautions Precautions: Fall Precaution Comments: monitor SpO2; HOH Restrictions Weight Bearing Restrictions: No      Mobility  Bed Mobility Overal bed mobility: Needs Assistance Bed Mobility: Rolling;Sidelying to Sit;Sit to Supine Rolling: Supervision Sidelying to sit: HOB elevated;Min guard   Sit to supine: Min guard   General bed mobility comments: Extra time, effort,  and cues to utilize rails to pull and push up to ascend trunk with sidelying > sit L EOB, pt able to manage legs, min guard for safety. Return to supine with min guard for safety. Rolling with use of rails, supervision.    Transfers Overall transfer level: Needs assistance Equipment used: None Transfers: Lateral/Scoot Transfers          Lateral/Scoot Transfers: Mod assist General transfer comment: Pt cued to scoot laterally EOB toward L to [facility] with pt initiating pushing through hands on bed and feet on ground, but pt minimally clearing buttocks preventing translation, needing modA to complete. Deferred coming to stand this date due to low SpO2 levels and increased respiratory effort.  Ambulation/Gait             General Gait Details: deferred this date due to low SpO2 levels and increased respiratory effort  Stairs            Wheelchair Mobility    Modified Rankin (Stroke Patients Only)       Balance Overall balance assessment: Needs assistance Sitting-balance support: Bilateral upper extremity supported;Feet supported Sitting balance-Leahy Scale: Poor Sitting balance - Comments: UE support and min guard static sitting EOB.       Standing balance comment: deferred this date due to low SpO2 levels and increased respiratory effort                             Pertinent Vitals/Pain Pain Assessment: No/denies pain    Home Living Family/patient expects to be discharged to:: Skilled nursing facility  Additional Comments: Pt was most recently at [facility] facility receiving therapy services while trying to wean O2. Pt reports her daughter will make the decision on the next location/step.    Prior Function Level of Independence: Needs assistance   Gait / Transfers Assistance Needed: Pt reports most recently not walking yet except several small lateral steps to chair with therapists at [facility] facility.           Hand Dominance         Extremity/Trunk Assessment   Upper Extremity Assessment Upper Extremity Assessment: Generalized weakness (MMT scores of 4 to 4+ grossly, intact sensation to light touch, no dysdiadochokinesia or dysmetria noted)    Lower Extremity Assessment Lower Extremity Assessment: Generalized weakness (MMT scores of 4- to 4 grossly, intact sensation to light touch, no dysdiadochokinesia or dysmetria noted)    Cervical / Trunk Assessment Cervical / Trunk Assessment: Kyphotic  Communication   Communication: HOH  Cognition Arousal/Alertness: Awake/alert Behavior During Therapy: WFL for tasks assessed/performed Overall Cognitive Status: Within Functional Limits for tasks assessed                                 General Comments: A&Ox4.      General Comments General comments (skin integrity, edema, etc.): Increased HFNC O2 from 4 > 6 L/min due to SpO2 dropping to 80-86% sitting EOB with increased shortness of breathe noted, no significant improvement with increased O2 thus returned to supine with it improving to >/= 95% on 4L/min    Exercises     Assessment/Plan    PT Assessment Patient needs continued PT services  PT Problem List Decreased strength;Decreased activity tolerance;Decreased balance;Decreased mobility;Cardiopulmonary status limiting activity       PT Treatment Interventions DME instruction;Gait training;Functional mobility training;Therapeutic activities;Therapeutic exercise;Balance training;Neuromuscular re-education;Patient/family education    PT Goals (Current goals can be found in the Care Plan section)  Acute Rehab PT Goals Patient Stated Goal: to improve PT Goal Formulation: With patient Time For Goal Achievement: 01/23/21 Potential to Achieve Goals: Fair    Frequency Min 3X/week   Barriers to discharge        Co-evaluation               AM-PAC PT "6 Clicks" Mobility  Outcome Measure Help needed turning from your back to your side  while in a flat bed without using bedrails?: A Little Help needed moving from lying on your back to sitting on the side of a flat bed without using bedrails?: A Little Help needed moving to and from a bed to a chair (including a wheelchair)?: A Lot Help needed standing up from a chair using your arms (e.g., wheelchair or bedside chair)?: A Lot Help needed to walk in hospital room?: Total Help needed climbing 3-5 steps with a railing? : Total 6 Click Score: 12    End of Session Equipment Utilized During Treatment: Gait belt Activity Tolerance: Patient limited by fatigue;Treatment limited secondary to medical complications (Comment) (drop in SpO2) Patient left: in bed;with call bell/phone within reach;with bed alarm set Nurse Communication: Mobility status;Other (comment) (SpO2 levels) PT Visit Diagnosis: Muscle weakness (generalized) (M62.81);Difficulty in walking, not elsewhere classified (R26.2)    Time: 5093-2671 PT Time Calculation (min) (ACUTE ONLY): 29 min   Charges:   PT Evaluation $PT Eval Moderate Complexity: 1 Mod PT Treatments $Therapeutic Activity: 8-22 mins        Raymond Gurney, PT, DPT Acute Rehabilitation  Services  Pager: [phone] Office: [phone]   Jewel Baize 01/09/2021, 5:30 PM

## 2021-01-09 NOTE — Progress Notes (Addendum)
Daily Rounding Note  01/09/2021, 2:16 PM  LOS: 2 days   SUBJECTIVE:   Chief complaint:  Bloody BM's, diverticulitis  Multiple stools yesterday and into this AM after gatorade.    Stools were bloody yesterday,  Muddy w some blood today.   Abdominal pain is better.  No n/v.  On clears  Patient tells me she has had diverticulitis in the past.  OBJECTIVE:         Vital signs in last 24 hours:    Temp:  [97.7 F (36.5 C)-98.2 F (36.8 C)] 98.2 F (36.8 C) (01/22 1215) Pulse Rate:  [89-105] 100 (01/22 1215) Resp:  [16-21] 21 (01/22 1215) BP: (130-161)/(59-88) 130/62 (01/22 1215) SpO2:  [94 %-100 %] 95 % (01/22 1215) Last BM Date: 01/08/21 Filed Weights   01/07/21 2143  Weight: 77.1 kg   General: Comfortable, somewhat chronically ill-appearing.  Alert.  Hard of hearing Heart: RRR Chest: No labored breathing.  No cough Abdomen: Soft.  Still tender but without guarding or rebound in the left abdomen.  Bowel sounds normal, active. Extremities: No CCE. Neuro/Psych: Hard of hearing.  Appropriate.  Follows commands.  Oriented x3.  Intake/Output from previous day: 01/21 0701 - 01/22 0700 In: 290.4 [I.V.:90.4; IV Piggyback:200] Out: 3200 [Urine:3200]  Intake/Output this shift: Total I/O In: -  Out: 375 [Urine:375]  Lab Results: Recent Labs    01/07/21 2148 01/08/21 0615 01/09/21 0203  WBC 15.8* 14.8* 15.5*  HGB 7.2* 9.6* 10.7*  HCT 22.6* 27.9* 33.2*  PLT 517* 537* 578*   BMET Recent Labs    01/07/21 2148 01/08/21 0615  NA 132* 134*  K 4.2 4.1  CL 97* 95*  CO2 29 31  GLUCOSE 107* 95  BUN 17 16  CREATININE 0.42* 0.48  CALCIUM 8.6* 9.0   LFT Recent Labs    01/07/21 2148  PROT 4.4*  ALBUMIN 2.1*  AST 19  ALT 40  ALKPHOS 61  BILITOT 0.6   PT/INR Recent Labs    01/07/21 2148 01/08/21 0615  LABPROT 13.2 12.1  INR 1.0 0.9   Hepatitis Panel No results for input(s): HEPBSAG, HCVAB, HEPAIGM,  HEPBIGM in the last 72 hours.  Studies/Results: CT ABDOMEN PELVIS W CONTRAST  Result Date: 01/08/2021 CLINICAL DATA:  Left lower quadrant pain EXAM: CT ABDOMEN AND PELVIS WITH CONTRAST TECHNIQUE: Multidetector CT imaging of the abdomen and pelvis was performed using the standard protocol following bolus administration of intravenous contrast. CONTRAST:  OMNIPAQUE IOHEXOL 300 MG/ML  SOLN COMPARISON:  None. FINDINGS: Lower chest: Lung bases demonstrate small left greater than right pleural effusions. Irregular consolidations and ground-glass densities suspicious for bilateral pneumonia. Hepatobiliary: Subcentimeter hypodensity within the anterior left hepatic lobe too small to further characterize. No calcified gallstone or biliary dilatation Pancreas: Unremarkable. No pancreatic ductal dilatation or surrounding inflammatory changes. Spleen: Normal in size without focal abnormality. Adrenals/Urinary Tract: Adrenal glands are normal. Kidneys show no hydronephrosis. Nonspecific left perinephric fluid. The bladder is decompressed by Foley catheter. Stomach/Bowel: The stomach is nonenlarged. There is no dilated small bowel. There is large amount of stool in the colon. There is moderate rectal distension by feces. Mild soft tissue stranding at the sigmoid colon with diverticula present suspicious for diverticulitis. Focal inflammatory soft tissue density adjacent to the sigmoid colon measuring 2.4 x 2.3 cm with small internal gas bubble. Vascular/Lymphatic: Nonaneurysmal aorta. Mild aortic atherosclerosis. No suspicious adenopathy Reproductive: Uterus and adnexa are unremarkable Other: No free air.  Mild presacral soft tissue stranding. Musculoskeletal: Probable chronic mild superior endplate deformity at T10 IMPRESSION: 1. Inflammatory soft tissue stranding and mild wall thickening at the sigmoid colon, consistent with acute diverticulitis. 2.4 cm focal inflammatory mass adjacent to sigmoid colon with tiny gas  bubble, this could represent contained perforation, phlegmon/developing abscess, or inflamed giant diverticulum given sizable diverticula elsewhere in the sigmoid colon. Follow-up colonoscopy recommended to exclude a mass after resolution of acute presentation. 2. Large amount of stool in the colon with moderate rectal distension by feces. 3. Small left greater than right pleural effusions. Irregular consolidations and ground-glass densities at the lung bases suspicious for bilateral pneumonia, possible atypical or viral pneumonia. These results will be called to the ordering clinician or representative by the Radiologist Assistant, and communication documented in the PACS or [employer]. Aortic Atherosclerosis (ICD10-I70.0). Electronically Signed   By: Jasmine Pang M.D.   On: 01/08/2021 19:14    ASSESMENT:   *   Burgundy, bloody stool, suspect lower GI bleed.  LLQ pain.   Large stool ball, fecal impaction raises suspicion for stercoral ulcer. 01/08/2021 CTAP w contrast: Soft tissue stranding/inflammation, mild wall thickening of sigmoid consistent with acute diverticulitis.  2.4 cm inflammatory mass adjacent to sigmoid with a tiny gas bubbles could represent contained perforation, phlegmon, developing abscess, inflamed giant diverticulum.  Large amount of stool in the colon with rectal distention. Day 2 Rocephin, IV Flagyl.   Multiple bloody stools after Gatorade/MiraLAX prep but stools have not completely cleared.  No plans for colonoscopy  *    Normocytic anemia.  Suggest some anemia from acute blood loss as well as anemia of chronic disease and recent acute illness. Hgb 7.2 >> 1 PRBC >> 9.6 >> 10.7  *    Chronic dysphagia, longstanding.  Esophageal dysmotility vs esophageal stricture.  No previous esophageal dilatations.  *     LTAC admission following COVID-19 pneumonia hospitalization and difficulty weaning off high flow oxygen. Persistent leukocytosis. Chest images on the CTAP showed  left greater than right pleural effusions.  Consolidations, groundglass densities at lung bases. Receiving methylprednisolone. *   Physical debilitation.  *     Hyponatremia.   PLAN   *    No plans for colonoscopy given findings of diverticulitis.  *    Going to start MiraLAX twice daily beginning tomorrow  *    CBC in the a.m.  *   Question advance diet.  Currently on clears, believes she could tolerate at least full liquids.    Jennye Moccasin  01/09/2021, 2:16 PM Phone [phone]  I have discussed the case with the PA, and that is the plan I formulated. I personally interviewed and examined the patient.  I also personally reviewed the images from yesterday CT abdomen and pelvis.  Acute sigmoid diverticulitis with small pericolonic abscess is the cause of her left lower quadrant pain and tenderness.  No drainable abscess or surgery needed at this point.  She is on appropriate antibiotic coverage. CT scan confirmed obstipation found on rectal exam.  Patient reportedly had large volume stool output after oral contrast and then MoviPrep. Twice daily MiraLAX has been started.  There was some ongoing rectal bleeding with relief of the constipation last evening.  We suspect this patient most likely has stercoral ulcer from obstipation causing the bleeding.  I am not yet planning a sigmoidoscopy due to the acute diverticulitis.  That scope may yet be necessary prior to discharge, particularly if the bleeding continues.  Rectal exam tomorrow to make sure that the impaction is relieved (to make sure she is not passing stool around a persistent impaction).  Diet increased to full liquids with protein calorie supplements  Charlie Pitter III Office: [phone]

## 2021-01-10 DIAGNOSIS — K5900 Constipation, unspecified: Secondary | ICD-10-CM | POA: Diagnosis not present

## 2021-01-10 DIAGNOSIS — K5792 Diverticulitis of intestine, part unspecified, without perforation or abscess without bleeding: Secondary | ICD-10-CM | POA: Diagnosis not present

## 2021-01-10 DIAGNOSIS — K921 Melena: Secondary | ICD-10-CM | POA: Diagnosis not present

## 2021-01-10 DIAGNOSIS — D62 Acute posthemorrhagic anemia: Secondary | ICD-10-CM | POA: Diagnosis not present

## 2021-01-10 LAB — CBC
HCT: 26.3 % — ABNORMAL LOW (ref 36.0–46.0)
Hemoglobin: 8.8 g/dL — ABNORMAL LOW (ref 12.0–15.0)
MCH: 31.1 pg (ref 26.0–34.0)
MCHC: 33.5 g/dL (ref 30.0–36.0)
MCV: 92.9 fL (ref 80.0–100.0)
Platelets: 509 10*3/uL — ABNORMAL HIGH (ref 150–400)
RBC: 2.83 MIL/uL — ABNORMAL LOW (ref 3.87–5.11)
RDW: 16.2 % — ABNORMAL HIGH (ref 11.5–15.5)
WBC: 12.8 10*3/uL — ABNORMAL HIGH (ref 4.0–10.5)
nRBC: 0.2 % (ref 0.0–0.2)

## 2021-01-10 LAB — GLUCOSE, CAPILLARY
Glucose-Capillary: 170 mg/dL — ABNORMAL HIGH (ref 70–99)
Glucose-Capillary: 197 mg/dL — ABNORMAL HIGH (ref 70–99)
Glucose-Capillary: 227 mg/dL — ABNORMAL HIGH (ref 70–99)
Glucose-Capillary: 83 mg/dL (ref 70–99)

## 2021-01-10 LAB — BASIC METABOLIC PANEL
Anion gap: 9 (ref 5–15)
BUN: 12 mg/dL (ref 8–23)
CO2: 25 mmol/L (ref 22–32)
Calcium: 8.6 mg/dL — ABNORMAL LOW (ref 8.9–10.3)
Chloride: 102 mmol/L (ref 98–111)
Creatinine, Ser: 0.53 mg/dL (ref 0.44–1.00)
GFR, Estimated: >60 mL/min (ref >60)
Glucose, Bld: 85 mg/dL (ref 70–99)
Potassium: 3.5 mmol/L (ref 3.5–5.1)
Sodium: 136 mmol/L (ref 135–145)

## 2021-01-10 LAB — HEMOGLOBIN AND HEMATOCRIT, BLOOD
HCT: 28.6 % — ABNORMAL LOW (ref 36.0–46.0)
Hemoglobin: 9.1 g/dL — ABNORMAL LOW (ref 12.0–15.0)

## 2021-01-10 MED ORDER — POLYETHYLENE GLYCOL 3350 17 G PO PACK
17.0000 g | PACK | Freq: Every day | ORAL | Status: DC
  Administered 2021-01-11 – 2021-01-12 (×2): 17 g via ORAL
  Filled 2021-01-10 (×3): qty 1

## 2021-01-10 MED ORDER — CLOTRIMAZOLE 2 % VA CREA
1.0000 | TOPICAL_CREAM | Freq: Every day | VAGINAL | Status: AC
  Administered 2021-01-10 – 2021-01-12 (×3): 1 via VAGINAL
  Filled 2021-01-10 (×2): qty 21

## 2021-01-10 NOTE — Progress Notes (Addendum)
Daily Rounding Note  01/10/2021, 9:19 AM  LOS: 3 days   SUBJECTIVE:   Chief complaint: Diverticulitis.  Lower GI bleed, burgundy stools.  Stools are now brown, liquid.  Had at least 1 bowel movement last night and a couple this morning.  Patient denies abdominal pain.  Tolerating full liquid diet.  OBJECTIVE:         Vital signs in last 24 hours:    Temp:  [98 F (36.7 C)-98.7 F (37.1 C)] 98 F (36.7 C) (01/23 0818) Pulse Rate:  [93-100] 98 (01/23 0818) Resp:  [17-21] 17 (01/23 0818) BP: (123-143)/(54-71) 143/71 (01/23 0818) SpO2:  [95 %-98 %] 95 % (01/23 0818) Last BM Date: 01/09/21 Filed Weights   01/07/21 2143  Weight: 77.1 kg   General: Alert, pleasant, extremely HOH.  Comfortable in the bed. Heart: RRR. Chest: No labored breathing or cough. Abdomen: Soft.  Tenderness without guarding or rebound in the left mid lower abdomen.  Crescent pattern of moderate sized bruises across the lower abdomen bilaterally Rectal: Incontinent of thick, liquid, dark brown but not bloody stool.  Same stool present in rectum but no fecal impaction. Extremities: No CCE. Neuro/Psych: Oriented x3.  Hard of hearing.  Moves all 4 limbs  Intake/Output from previous day: 01/22 0701 - 01/23 0700 In: -  Out: 1025 [Urine:1025]  Intake/Output this shift: No intake/output data recorded.  Lab Results: Recent Labs    01/08/21 0615 01/09/21 0203 01/10/21 0210  WBC 14.8* 15.5* 12.8*  HGB 9.6* 10.7* 8.8*  HCT 27.9* 33.2* 26.3*  PLT 537* 578* 509*   BMET Recent Labs    01/08/21 0615 01/09/21 1600 01/10/21 0210  NA 134* 135 136  K 4.1 4.5 3.5  CL 95* 99 102  CO2 31 28 25   GLUCOSE 95 147* 85  BUN 16 10 12   CREATININE 0.48 0.50 0.53  CALCIUM 9.0 8.8* 8.6*   LFT Recent Labs    01/07/21 2148  PROT 4.4*  ALBUMIN 2.1*  AST 19  ALT 40  ALKPHOS 61  BILITOT 0.6   PT/INR Recent Labs    01/07/21 2148 01/08/21 0615   LABPROT 13.2 12.1  INR 1.0 0.9   Hepatitis Panel No results for input(s): HEPBSAG, HCVAB, HEPAIGM, HEPBIGM in the last 72 hours.  Studies/Results: CT ABDOMEN PELVIS W CONTRAST  Result Date: 01/08/2021 CLINICAL DATA:  Left lower quadrant pain EXAM: CT ABDOMEN AND PELVIS WITH CONTRAST TECHNIQUE: Multidetector CT imaging of the abdomen and pelvis was performed using the standard protocol following bolus administration of intravenous contrast. CONTRAST:  01/10/21 OMNIPAQUE IOHEXOL 300 MG/ML  SOLN COMPARISON:  None. FINDINGS: Lower chest: Lung bases demonstrate small left greater than right pleural effusions. Irregular consolidations and ground-glass densities suspicious for bilateral pneumonia. Hepatobiliary: Subcentimeter hypodensity within the anterior left hepatic lobe too small to further characterize. No calcified gallstone or biliary dilatation Pancreas: Unremarkable. No pancreatic ductal dilatation or surrounding inflammatory changes. Spleen: Normal in size without focal abnormality. Adrenals/Urinary Tract: Adrenal glands are normal. Kidneys show no hydronephrosis. Nonspecific left perinephric fluid. The bladder is decompressed by Foley catheter. Stomach/Bowel: The stomach is nonenlarged. There is no dilated small bowel. There is large amount of stool in the colon. There is moderate rectal distension by feces. Mild soft tissue stranding at the sigmoid colon with diverticula present suspicious for diverticulitis. Focal inflammatory soft tissue density adjacent to the sigmoid colon measuring 2.4 x 2.3 cm with small internal gas bubble. Vascular/Lymphatic: Nonaneurysmal aorta.  Mild aortic atherosclerosis. No suspicious adenopathy Reproductive: Uterus and adnexa are unremarkable Other: No free air.  Mild presacral soft tissue stranding. Musculoskeletal: Probable chronic mild superior endplate deformity at T10 IMPRESSION: 1. Inflammatory soft tissue stranding and mild wall thickening at the sigmoid colon,  consistent with acute diverticulitis. 2.4 cm focal inflammatory mass adjacent to sigmoid colon with tiny gas bubble, this could represent contained perforation, phlegmon/developing abscess, or inflamed giant diverticulum given sizable diverticula elsewhere in the sigmoid colon. Follow-up colonoscopy recommended to exclude a mass after resolution of acute presentation. 2. Large amount of stool in the colon with moderate rectal distension by feces. 3. Small left greater than right pleural effusions. Irregular consolidations and ground-glass densities at the lung bases suspicious for bilateral pneumonia, possible atypical or viral pneumonia. These results will be called to the ordering clinician or representative by the Radiologist Assistant, and communication documented in the PACS or [employer]. Aortic Atherosclerosis (ICD10-I70.0). Electronically Signed   By: Jasmine Pang M.D.   On: 01/08/2021 19:14   Scheduled Meds: . Chlorhexidine Gluconate Cloth  6 each Topical Q0600  . feeding supplement  1 Container Oral TID BM  . metoprolol succinate  25 mg Oral Daily  . mupirocin ointment  1 application Nasal BID  . pantoprazole  40 mg Oral Q0600  . polyethylene glycol  17 g Oral BID  . [START ON 01/13/2021] predniSONE  10 mg Oral Q breakfast  . predniSONE  20 mg Oral Q breakfast   Continuous Infusions: . cefTRIAXone (ROCEPHIN)  IV 1 g (01/09/21 2054)  . metronidazole 500 mg (01/10/21 0542)   PRN Meds:.acetaminophen **OR** acetaminophen   ASSESMENT:   *   Joanne Hardin, bloody stool, suspect lower GI bleed.  LLQ pain.   Large stool ball, fecal impaction so suspect stercoral ulcer.  Used laxatives for constipation fairly regularly at home. Abundant bloody stool output after gatorade/miralax "prep" 10/21.  Starts bid Miralax today.    *   Sigmoid diverticulitis w 2.4 cm abscess/contained perf.   01/08/2021 CTAP w contrast: Soft tissue stranding/inflammation, mild wall thickening of sigmoid consistent  with acute diverticulitis.  2.4 cm inflammatory mass adjacent to sigmoid with a tiny gas bubbles could represent contained perforation, phlegmon, developing abscess, inflamed giant diverticulum.  Large amount of stool in the colon with rectal distention. Day 3 Rocephin, IV Flagyl.    *    Normocytic anemia.  From acute blood loss as well as anemia of chronic disease and recent acute illness. Hgb 7.2 >> 1 PRBC >> 9.6 >> 10.7 >> 8.8.    *    Chronic dysphagia, longstanding.  Esophageal dysmotility vs esophageal stricture.  No previous esophageal dilatations.  At [facility] was on chopped, thin liquid diet.  *     LTAC admission following COVID-19 pneumonia hospitalization and difficulty weaning off high flow oxygen. Persistent leukocytosis. 1/21 CTAP w L >> R pleural effusions, consolidations, groundglass densities at lung bases. Receiving methylprednisolone.  *   Physical debilitation.   PLAN   *    Resume chopped, thin liquid diet.  *    Drop MiraLAX to once daily but titrate to bowel movements at least once per day.    Jennye Moccasin  01/10/2021, 9:19 AM Phone [phone]  I have discussed the case with the PA, and that is the plan I formulated. I personally interviewed and examined the patient. History limited since she is so hard of hearing.  Her son was at the bedside for my entire  visit  She has much less tenderness in the left lower quadrant, indicating improvement in the diverticulitis. She has had significant stool output from bowel regimen given thus far, and resolution of bleeding.  We still suspect this bleeding was likely from a stercoral ulcer due to obstipation.  As such, I feel patient would benefit from a limited scope to about the rectosigmoid junction prior to discharge.  This is somewhat increased but acceptable risk in the setting of diverticulitis since she is improving, and also with the use of CO2 insufflation in our endoscopy lab.  I think it would be helpful to  make a more confident diagnosis on the source of the bleeding in case there should be recurrence of it after she is released back to rehab.  We are scaling back the constipation treatment, will continue antibiotics, have advance patient to regular diet and will continue to follow her during the hospital stay.  Dr. Yancey Flemings will manage the consult service starting tomorrow.  Charlie Pitter III Office: [phone]

## 2021-01-10 NOTE — Progress Notes (Signed)
PROGRESS NOTE    Joanne Hardin  WUJ:[id number]  DOB: 1940-12-27  DOA: 01/07/2021 PCP: Home, [facility]:   Hospital course:  80 year old woman with history significant for recent COVID infection and subsequent hypoxemic respiratory failure on high flow nasal cannula and chronic steroids was admitted 01/07/2021 for melenic stool.  Work-up was notable for CT abdomen pelvis showed diverticulitis and a large stool burden.  Patient was admitted for IV antibiotics, transfusion and disimpaction.   Subjective:  Patient notes that she is pooping a lot.  Notes that it is liquid.  Her son also notes that she is having multiple bowel movements.  Denies abdominal pain.  Patient states she has no other concerns.  She is very hard of hearing and notes it is hard for her to communicate.   Objective: Vitals:   01/09/21 2359 01/10/21 0313 01/10/21 0818 01/10/21 1211  BP: (!) 123/54 (!) 143/65 (!) 143/71 (!) 141/66  Pulse: 99 93 98 89  Resp: 20 18 17 20   Temp: 98.3 F (36.8 C) 98.1 F (36.7 C) 98 F (36.7 C) 98.7 F (37.1 C)  TempSrc: Oral Oral Oral Oral  SpO2: 98% 96% 95% 98%  Weight:      Height:        Intake/Output Summary (Last 24 hours) at 01/10/2021 1522 Last data filed at 01/09/2021 1600 Gross per 24 hour  Intake -  Output 650 ml  Net -650 ml   Filed Weights   01/07/21 2143  Weight: 77.1 kg     Exam:  General: Chronically ill-appearing female lying in bed in no acute distress with attentive son at bedside Eyes: sclera anicteric, conjuctiva mild injection bilaterally CVS: S1-S2, regular  Respiratory:  decreased air entry bilaterally secondary to decreased inspiratory effort, rales at bases  GI: NABS, abdomen is soft.  There is no tenderness to palpation throughout however there is an area of firmness at the right lower quadrant that is not tender. Neuro: grossly nonfocal, very hard of hearing Psych: mood and affect appropriate to  situation.   Assessment & Plan:   80 year old female with chronic oxygen needs secondary to COVID infection now presents with acute blood loss anemia and diverticulitis.  Acute blood loss anemia Thought to be diverticular bleed in setting of diverticulitis versus PUD GI has been following, appreciate input Patient is status post 2 units PRBC and hemoglobin seems to be stabilized around 9, but will need to follow closely as it may well drift down.  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 Continue oxygen  HTN Blood pressure is holding steady on metoprolol    DVT prophylaxis: SCD Code Status: Full Family Communication: Son was at bedside throughout Disposition Plan:   Patient is from: SNF  Anticipated Discharge Location: LTAC (previously at [facility])  Barriers to Discharge: On IV antibiotics for diverticulitis  Is patient medically stable for Discharge: No   Consultants:  GI  Procedures:  None  Antimicrobials:  Ceftriaxone  Metronidazole   Data Reviewed:  Basic Metabolic Panel: Recent Labs  Lab 01/07/21 2148 01/08/21 0615 01/09/21 1600 01/10/21 0210  NA 132* 134* 135 136  K 4.2 4.1 4.5 3.5  CL 97* 95* 99 102  CO2 29 31 28  25  GLUCOSE 107* 95 147* 85  BUN 17 16 10 12   CREATININE 0.42* 0.48 0.50 0.53  CALCIUM 8.6* 9.0 8.8* 8.6*   Liver Function Tests: Recent Labs  Lab 01/07/21 2148  AST 19  ALT 40  ALKPHOS 61  BILITOT 0.6  PROT 4.4*  ALBUMIN 2.1*   No results for input(s): LIPASE, AMYLASE in the last 168 hours. No results for input(s): AMMONIA in the last 168 hours. CBC: Recent Labs  Lab 01/07/21 2148 01/08/21 0615 01/09/21 0203 01/10/21 0210 01/10/21 1424  WBC 15.8* 14.8*  15.5* 12.8*  --   NEUTROABS 12.2*  --   --   --   --   HGB 7.2* 9.6* 10.7* 8.8* 9.1*  HCT 22.6* 27.9* 33.2* 26.3* 28.6*  MCV 92.6 91.2 92.5 92.9  --   PLT 517* 537* 578* 509*  --    Cardiac Enzymes: No results for input(s): CKTOTAL, CKMB, CKMBINDEX, TROPONINI in the last 168 hours. BNP (last 3 results) No results for input(s): PROBNP in the last 8760 hours. CBG: Recent Labs  Lab 01/09/21 0014 01/09/21 0637 01/10/21 0005 01/10/21 0544 01/10/21 1225  GLUCAP 87 76 197* 83 170*    Recent Results (from the past 240 hour(s))  SARS Coronavirus 2 by RT PCR (hospital order, performed in [facility] hospital lab) Nasopharyngeal Nasopharyngeal Swab     Status: None   Collection Time: 01/08/21  1:01 AM   Specimen: Nasopharyngeal Swab  Result Value Ref Range Status   SARS Coronavirus 2 NEGATIVE NEGATIVE Final    Comment: (NOTE) SARS-CoV-2 target nucleic acids are NOT DETECTED.  The SARS-CoV-2 RNA is generally detectable in upper and lower respiratory specimens during the acute phase of infection. The lowest concentration of SARS-CoV-2 viral copies this assay can detect is 250 copies / mL. A negative result does not preclude SARS-CoV-2 infection and should not be used as the sole basis for treatment or other patient management decisions.  A negative result may occur with improper specimen collection / handling, submission of specimen other than nasopharyngeal swab, presence of viral mutation(s) within the areas targeted by this assay, and inadequate number of viral copies (<250 copies / mL). A negative result must be combined with clinical observations, patient history, and epidemiological information.  Fact Sheet for Patients:   01/10/21  Fact Sheet for Healthcare Providers: BoilerBrush.com.cy  This test is not yet approved or  cleared by the https://pope.com/ FDA and has been authorized for detection and/or diagnosis of  SARS-CoV-2 by FDA under an Emergency Use Authorization (EUA).  This EUA will remain in effect (meaning this test can be used) for the duration of the COVID-19 declaration under Section 564(b)(1) of the Act, 21 U.S.C. section 360bbb-3(b)(1), unless the authorization is terminated or revoked sooner.  Performed at [facility], [address] [place], [place]   MRSA PCR Screening     Status: Abnormal   Collection Time: 01/08/21  6:37 AM   Specimen: Nasal Mucosa; Nasopharyngeal  Result Value Ref Range Status   MRSA by PCR POSITIVE (A) NEGATIVE Final    Comment:        The GeneXpert MRSA Assay (FDA approved for NASAL specimens only), is one component of a comprehensive MRSA colonization surveillance program. It is not intended to diagnose MRSA infection nor to guide or monitor treatment for MRSA infections. RESULT CALLED TO, READ BACK BY AND VERIFIED WITH: 01/10/21 RN 11:15 01/08/21 ([email or web address]) Performed at [facility], [address]  [place], [place] [address]       Studies: CT ABDOMEN PELVIS W CONTRAST  Result Date: 01/08/2021 CLINICAL DATA:  Left lower quadrant pain EXAM: CT ABDOMEN AND PELVIS WITH CONTRAST TECHNIQUE: Multidetector CT imaging of the abdomen and pelvis was performed using the standard protocol following bolus administration of intravenous contrast. CONTRAST:  OMNIPAQUE IOHEXOL 300 MG/ML  SOLN COMPARISON:  None. FINDINGS: Lower chest: Lung bases demonstrate small left greater than right pleural effusions. Irregular consolidations and ground-glass densities suspicious for bilateral pneumonia. Hepatobiliary: Subcentimeter hypodensity within the anterior left hepatic lobe too small to further characterize. No calcified gallstone or biliary dilatation Pancreas: Unremarkable. No pancreatic ductal dilatation or surrounding inflammatory changes. Spleen: Normal in size without focal abnormality. Adrenals/Urinary Tract: Adrenal glands are normal.  Kidneys show no hydronephrosis. Nonspecific left perinephric fluid. The bladder is decompressed by Foley catheter. Stomach/Bowel: The stomach is nonenlarged. There is no dilated small bowel. There is large amount of stool in the colon. There is moderate rectal distension by feces. Mild soft tissue stranding at the sigmoid colon with diverticula present suspicious for diverticulitis. Focal inflammatory soft tissue density adjacent to the sigmoid colon measuring 2.4 x 2.3 cm with small internal gas bubble. Vascular/Lymphatic: Nonaneurysmal aorta. Mild aortic atherosclerosis. No suspicious adenopathy Reproductive: Uterus and adnexa are unremarkable Other: No free air.  Mild presacral soft tissue stranding. Musculoskeletal: Probable chronic mild superior endplate deformity at T10 IMPRESSION: 1. Inflammatory soft tissue stranding and mild wall thickening at the sigmoid colon, consistent with acute diverticulitis. 2.4 cm focal inflammatory mass adjacent to sigmoid colon with tiny gas bubble, this could represent contained perforation, phlegmon/developing abscess, or inflamed giant diverticulum given sizable diverticula elsewhere in the sigmoid colon. Follow-up colonoscopy recommended to exclude a mass after resolution of acute presentation. 2. Large amount of stool in the colon with moderate rectal distension by feces. 3. Small left greater than right pleural effusions. Irregular consolidations and ground-glass densities at the lung bases suspicious for bilateral pneumonia, possible atypical or viral pneumonia. These results will be called to the ordering clinician or representative by the Radiologist Assistant, and communication documented in the PACS or [employer]. Aortic Atherosclerosis (ICD10-I70.0). Electronically Signed   By: Jasmine Pang M.D.   On: 01/08/2021 19:14     Scheduled Meds: . Chlorhexidine Gluconate Cloth  6 each Topical Q0600  . clotrimazole  1 Applicatorful Vaginal QHS  . feeding  supplement  1 Container Oral TID BM  . metoprolol succinate  25 mg Oral Daily  . mupirocin ointment  1 application Nasal BID  . pantoprazole  40 mg Oral Q0600  . [START ON 01/11/2021] polyethylene glycol  17 g Oral Daily  . [START ON 01/13/2021] predniSONE  10 mg Oral Q breakfast  . predniSONE  20 mg Oral Q breakfast   Continuous Infusions: . cefTRIAXone (ROCEPHIN)  IV 1 g (01/09/21 2054)  . metronidazole 500 mg (01/10/21 1414)    Principal Problem:   Acute GI bleeding Active Problems:   Acute respiratory failure due to COVID-19 [facility])   Essential hypertension   Acute blood loss anemia     Srobona Orma Flaming, [facility]  If 7PM-7AM, please contact night-coverage [email or web address] Password [email or web address] 01/10/2021, 3:22 PM    LOS: 3 days

## 2021-01-11 LAB — BASIC METABOLIC PANEL
Anion gap: 7 (ref 5–15)
BUN: 9 mg/dL (ref 8–23)
CO2: 28 mmol/L (ref 22–32)
Calcium: 8.3 mg/dL — ABNORMAL LOW (ref 8.9–10.3)
Chloride: 101 mmol/L (ref 98–111)
Creatinine, Ser: 0.4 mg/dL — ABNORMAL LOW (ref 0.44–1.00)
GFR, Estimated: >60 mL/min (ref >60)
Glucose, Bld: 96 mg/dL (ref 70–99)
Potassium: 3.2 mmol/L — ABNORMAL LOW (ref 3.5–5.1)
Sodium: 136 mmol/L (ref 135–145)

## 2021-01-11 LAB — CBC
HCT: 27.2 % — ABNORMAL LOW (ref 36.0–46.0)
Hemoglobin: 8.7 g/dL — ABNORMAL LOW (ref 12.0–15.0)
MCH: 30.1 pg (ref 26.0–34.0)
MCHC: 32 g/dL (ref 30.0–36.0)
MCV: 94.1 fL (ref 80.0–100.0)
Platelets: 466 10*3/uL — ABNORMAL HIGH (ref 150–400)
RBC: 2.89 MIL/uL — ABNORMAL LOW (ref 3.87–5.11)
RDW: 16.1 % — ABNORMAL HIGH (ref 11.5–15.5)
WBC: 10.9 10*3/uL — ABNORMAL HIGH (ref 4.0–10.5)
nRBC: 0 % (ref 0.0–0.2)

## 2021-01-11 LAB — GLUCOSE, CAPILLARY
Glucose-Capillary: 126 mg/dL — ABNORMAL HIGH (ref 70–99)
Glucose-Capillary: 129 mg/dL — ABNORMAL HIGH (ref 70–99)
Glucose-Capillary: 141 mg/dL — ABNORMAL HIGH (ref 70–99)
Glucose-Capillary: 72 mg/dL (ref 70–99)
Glucose-Capillary: 80 mg/dL (ref 70–99)

## 2021-01-11 MED ORDER — POTASSIUM CHLORIDE 10 MEQ/100ML IV SOLN
10.0000 meq | INTRAVENOUS | Status: AC
Start: 2021-01-11 — End: 2021-01-12
  Administered 2021-01-11 – 2021-01-12 (×4): 10 meq via INTRAVENOUS
  Filled 2021-01-11 (×4): qty 100

## 2021-01-11 NOTE — Progress Notes (Addendum)
Progress Note  Chief Complaint:  Rectal bleeding       ASSESSMENT / PLAN:    # 80 yo female with PMH significant for HTN, hyperlipidemia, recent COVID. Admitted 01/09/21 with rectal bleeding / anemia.   # GI bleed, possibly secondary to a stercoral ulcer. CT scan on admission showed a large amount of stool in the colon / moderate rectal distention.   She completed a bowel prep with very successful purge per RN taking care of her for last few days. Now on daily Miralax. No further bleeding.  Hgb stable in upper 8 range post one unit of blood on 01/08/21   # Probable diverticulitis w/ abscess / contained perforation. CT scan on admit remarkable for  mild soft tissue stranding at the sigmoid colon with diverticula present suspicious for diverticulitis. Focal inflammatory soft tissue density adjacent to the sigmoid colon measuring 2.4 x 2.3 cm with small internal gas bubble.   Improving on Rocephin and flagyl.  --Plan is for (sometime prior to discharge) a limited scope to visualize rectosigmoid area. However she is at increase risk for the procedure in setting of diverticulitis.   # Recent COVID 19 PNA, went to [facility] for difficulty weaning off high flow oxygen. Getting steroids.       SUBJECTIVE:   Hard of hearing. No abdominal pain. She has been eating. No blood in stool per Nursing assistant     Scheduled inpatient medications:  . Chlorhexidine Gluconate Cloth  6 each Topical Q0600  . clotrimazole  1 Applicatorful Vaginal QHS  . feeding supplement  1 Container Oral TID BM  . metoprolol succinate  25 mg Oral Daily  . mupirocin ointment  1 application Nasal BID  . pantoprazole  40 mg Oral Q0600  . polyethylene glycol  17 g Oral Daily  . [START ON 01/13/2021] predniSONE  10 mg Oral Q breakfast  . predniSONE  20 mg Oral Q breakfast   Continuous inpatient infusions:  . cefTRIAXone (ROCEPHIN)  IV 1 g (01/10/21 2043)  . metronidazole 500 mg (01/11/21 1512)   PRN inpatient  medications: acetaminophen **OR** acetaminophen  Vital signs in last 24 hours: Temp:  [97.9 F (36.6 C)-98.8 F (37.1 C)] 98.6 F (37 C) (01/24 0812) Pulse Rate:  [83-98] 98 (01/24 0812) Resp:  [15-19] 17 (01/24 0812) BP: (143-164)/(66-76) 143/72 (01/24 0812) SpO2:  [97 %-100 %] 98 % (01/24 0812) Last BM Date: 01/10/21  Intake/Output Summary (Last 24 hours) at 01/11/2021 1653 Last data filed at 01/11/2021 1007 Gross per 24 hour  Intake -  Output 1400 ml  Net -1400 ml     Physical Exam:  . General: Alert female in NAD. Hard of hearing . Heart:  Regular rate and rhythm. . Pulmonary: Normal respiratory effort . Abdomen: Soft, nondistended, mild LUQ tenderness.  Normal bowel sounds.  . Neurologic: Alert and oriented . Psych: Pleasant. Cooperative.   Filed Weights   01/07/21 2143  Weight: 77.1 kg    Intake/Output from previous day: No intake/output data recorded. Intake/Output this shift: Total I/O In: -  Out: 1400 [Urine:1400]    Lab Results: Recent Labs    01/09/21 0203 01/10/21 0210 01/10/21 1424 01/11/21 0132  WBC 15.5* 12.8*  --  10.9*  HGB 10.7* 8.8* 9.1* 8.7*  HCT 33.2* 26.3* 28.6* 27.2*  PLT 578* 509*  --  466*   BMET Recent Labs    01/09/21 1600 01/10/21 0210 01/11/21 0132  NA 135 136 136  K 4.5 3.5  3.2*  CL 99 102 101  CO2 28 25 28   GLUCOSE 147* 85 96  BUN 10 12 9   CREATININE 0.50 0.53 0.40*  CALCIUM 8.8* 8.6* 8.3*   LFT No results for input(s): PROT, ALBUMIN, AST, ALT, ALKPHOS, BILITOT, BILIDIR, IBILI in the last 72 hours. PT/INR No results for input(s): LABPROT, INR in the last 72 hours. Hepatitis Panel No results for input(s): HEPBSAG, HCVAB, HEPAIGM, HEPBIGM in the last 72 hours.  No results found.    Principal Problem:   Acute GI bleeding Active Problems:   Acute respiratory failure due to COVID-19 [facility])   Essential hypertension   Acute blood loss anemia     LOS: 4 days   ,NP 01/11/2021, 4:53 PM  GI  ATTENDING  Interval history data reviewed.  No further bleeding.  Bowel has been purged.  Being treated for diverticular abscess.  Probably best for full colonoscopy after issue with abscess is resolved and pulmonary status amenable to endoscopic evaluation.  Will follow.  Willette Cluster. 01/13/2021., M.D. [facility] Division of Gastroenterology

## 2021-01-11 NOTE — Progress Notes (Signed)
Pt is from [facility]- spoke with Irving Burton at [facility] today, they will have a bed available tomorrow 1/25 and can accept pt back if medically stable to return tomorrow.

## 2021-01-11 NOTE — TOC Initial Note (Addendum)
Transition of Care (TOC) - Initial/Assessment Note  Donn Pierini RN, BSN Transitions of Care Unit 4E- RN Case Manager See Treatment Team for direct phone #    Patient Details  Name: Ninel Abdella MRN: [id number] Date of Birth: 10/31/1941  [facility] CM/SW Contact:    Darrold Span, RN Phone Number: 01/11/2021, 2:58 PM  Clinical Narrative:                 Pt admitted from [facility], son [name] at bedside and requested to speak with CM regarding transition plan. Per [name] family does not want patient returning to [facility] and would prefer a different LTACH closer to home if possible - provided list per Per CMS guidelines from medicare.gov website with star ratings (copy placed in shadow chart) with facilities within 100 miles from patients home- however there are not many to choose from. If patients 02 requirements have improved to the point that she could go to SNF then that would open up more options. [name] voiced understanding and will speak to rest of family. CM will f/u for transition plan and speak with MD to see where pt is at regarding oxygen needs.   [name] - phone #- [phone]  Expected Discharge Plan: Long Term Nursing Home Barriers to Discharge: Continued Medical Work up   Patient Goals and CMS Choice Patient states their goals for this hospitalization and ongoing recovery are:: rehab/LTACH CMS Medicare.gov Compare Post Acute Care list provided to:: Patient Represenative (must comment) Choice offered to / list presented to : Adult Children  Expected Discharge Plan and Services Expected Discharge Plan: Long Term Nursing Home   Discharge Planning Services: CM Consult Post Acute Care Choice: Long Term Acute Care (LTAC) Living arrangements for the past 2 months: Post-Acute Facility                                      Prior Living Arrangements/Services Living arrangements for the past 2 months: Post-Acute Facility Lives with::  Facility Resident Patient language and need for interpreter reviewed:: Yes Do you feel safe going back to the place where you live?: Yes      Need for Family Participation in Patient Care: Yes (Comment) Care giver support system in place?: Yes (comment)   Criminal Activity/Legal Involvement Pertinent to Current Situation/Hospitalization: No - Comment as needed  Activities of Daily Living      Permission Sought/Granted                  Emotional Assessment Appearance:: Appears stated age Attitude/Demeanor/Rapport: Engaged Affect (typically observed): Accepting Orientation: : Oriented to Self,Oriented to Place      Admission diagnosis:  Acute GI bleeding [K92.2] Lower GI bleed [K92.2] Patient Active Problem List   Diagnosis Date Noted  . Acute respiratory failure due to COVID-19 (HCC) 01/08/2021  . Essential hypertension 01/08/2021  . Acute blood loss anemia 01/08/2021  . Acute GI bleeding 01/07/2021   PCP:  Home, [facility] Pharmacy:  No Pharmacies Listed    Social Determinants of Health (SDOH) Interventions    Readmission Risk Interventions No flowsheet data found.

## 2021-01-11 NOTE — Evaluation (Signed)
Occupational Therapy Evaluation Patient Details Name: Joanne Hardin MRN: [id number] DOB: [birth year] Today's Date: 01/11/2021    History of Present Illness Pt is a 80 y.o. female with a PMH significant for HTN, hyperlipidemia, and recent COVID hospitalization now at [facility] facility for O2 weaning (on 40% FiO2) who presents with lower abdominal pain, bloody stools, decrease in hemoglobin, and GI bleed.   Clinical Impression   Pt PTA: Pt recently from [facility] and was receiving therapy and weaning O2. Pt currently limited by decreased strength, decreased moblity and decreased activity tolerance in order to increase independence with functional tasks. Pt set-upA to maxA for ADL. Pt fatigues very easily, pt with dizziness in standing, but BP stable 150/73. Pt minA+2 for sit to stand and minA overall for bed mobility. Pt's HR increased to 90-115 BPM with exertion. HFNC  4 L at rest >90%; pt requiring 6L O2 to stay >86% with exertion. Pt would benefit from continued OT skilled services for ADL, strength and increase activity tolerance. OT following acutely.     Follow Up Recommendations  SNF;Supervision/Assistance - 24 hour    Equipment Recommendations  3 in 1 bedside commode    Recommendations for Other Services       Precautions / Restrictions Precautions Precautions: Fall Precaution Comments: monitor SpO2; HOH Restrictions Weight Bearing Restrictions: No      Mobility Bed Mobility Overal bed mobility: Needs Assistance Bed Mobility: Rolling;Sidelying to Sit;Sit to Supine Rolling: Supervision Sidelying to sit: Min assist;HOB elevated   Sit to supine: Min assist   General bed mobility comments: + use of rails, increased time- rolling to L and push up to EOB from sidelying with minA    Transfers Overall transfer level: Needs assistance Equipment used: None Transfers: Lateral/Scoot Transfers;Sit to/from Stand          Lateral/Scoot Transfers: Mod assist General transfer  comment: Pt cued to scoot laterally EOB toward L to [facility] with pt initiating pushing through hands on bed and feet on ground, but pt minimally clearing buttocks preventing translation, needing modA to complete. Deferred coming to stand this date due to low SpO2 levels and increased respiratory effort.    Balance Overall balance assessment: Needs assistance Sitting-balance support: Bilateral upper extremity supported;Feet supported Sitting balance-Leahy Scale: Poor Sitting balance - Comments: UE support and min guard static sitting EOB.   Standing balance support: Bilateral upper extremity supported Standing balance-Leahy Scale: Poor Standing balance comment: reliant on BUE holding onto RW                           ADL either performed or assessed with clinical judgement   ADL Overall ADL's : Needs assistance/impaired Eating/Feeding: Set up;Sitting   Grooming: Minimal assistance;Sitting   Upper Body Bathing: Minimal assistance;Sitting   Lower Body Bathing: Minimal assistance;Sitting/lateral leans   Upper Body Dressing : Maximal assistance;Sitting;Standing   Lower Body Dressing: Minimal assistance;Sitting/lateral leans   Toilet Transfer: Maximal assistance;Cueing for safety;Stand-pivot   Toileting- Clothing Manipulation and Hygiene: Maximal assistance;Sitting/lateral lean;Sit to/from stand       Functional mobility during ADLs: Moderate assistance;Cueing for safety;Cueing for sequencing;Rolling walker General ADL Comments: Pt limited by decreased strength, decreased moblity and decreased activity tolerance in order to increase independence with functional tasks. Pt's HR increased to 90-115 BPM with exertion.     Vision Baseline Vision/History: Wears glasses Wears Glasses: At all times Patient Visual Report: No change from baseline Vision Assessment?: No apparent visual deficits  Perception     Praxis      Pertinent Vitals/Pain Pain Assessment: No/denies pain      Hand Dominance Right   Extremity/Trunk Assessment Upper Extremity Assessment Upper Extremity Assessment: Generalized weakness   Lower Extremity Assessment Lower Extremity Assessment: Generalized weakness   Cervical / Trunk Assessment Cervical / Trunk Assessment: Kyphotic   Communication Communication Communication: HOH   Cognition Arousal/Alertness: Awake/alert Behavior During Therapy: WFL for tasks assessed/performed Overall Cognitive Status: Within Functional Limits for tasks assessed                                 General Comments: A&Ox4.   General Comments  HFNC  4 L at rest >90%; pt requiring 6L O2 to stay >86% with exertion. Eldest son in room    Exercises Exercises: General Lower Extremity;General Upper Extremity General Exercises - Upper Extremity Shoulder Flexion: AROM;Both;10 reps;Supine Digit Composite Flexion: AROM;Strengthening;Both;10 reps;Supine Composite Extension: AROM;Strengthening;10 reps General Exercises - Lower Extremity Ankle Circles/Pumps: AROM;10 reps;Both;Supine Long Arc Quad: AROM;10 reps;Both;Supine   Shoulder Instructions      Home Living Family/patient expects to be discharged to:: Skilled nursing facility                                 Additional Comments: Pt was most recently at [facility] facility receiving therapy services while trying to wean O2. Pt reports her daughter will make the decision on the next location/step.      Prior Functioning/Environment Level of Independence: Needs assistance  Gait / Transfers Assistance Needed: Pt reports most recently not walking yet except several small lateral steps to chair with therapists at [facility] facility. ADL's / Homemaking Assistance Needed: Pt requires assist with ADL            OT Problem List: Decreased strength;Decreased activity tolerance;Impaired balance (sitting and/or standing);Decreased safety awareness;Decreased knowledge of use of DME or  AE;Pain;Cardiopulmonary status limiting activity      OT Treatment/Interventions: Self-care/ADL training;Therapeutic exercise;Energy conservation;DME and/or AE instruction;Therapeutic activities;Patient/family education;Balance training    OT Goals(Current goals can be found in the care plan section) Acute Rehab OT Goals Patient Stated Goal: per son "to go to SNF and then be well enough to go home." OT Goal Formulation: With patient Time For Goal Achievement: 01/25/21 Potential to Achieve Goals: Good ADL Goals Pt Will Perform Grooming: with set-up;sitting Pt Will Perform Upper Body Dressing: with set-up;sitting Pt Will Perform Lower Body Dressing: sitting/lateral leans;sit to/from stand;with mod assist Pt Will Transfer to Toilet: with min assist;stand pivot transfer;bedside commode Pt Will Perform Toileting - Clothing Manipulation and hygiene: with min assist;sitting/lateral leans;sit to/from stand Pt/caregiver will Perform Home Exercise Program: Increased strength;Both right and left upper extremity;With Supervision  OT Frequency: Min 2X/week   Barriers to D/C:            Co-evaluation              AM-PAC OT "6 Clicks" Daily Activity     Outcome Measure Help from another person eating meals?: A Little Help from another person taking care of personal grooming?: A Little Help from another person toileting, which includes using toliet, bedpan, or urinal?: A Lot Help from another person bathing (including washing, rinsing, drying)?: A Lot Help from another person to put on and taking off regular upper body clothing?: A Little Help from another person to put on and  taking off regular lower body clothing?: A Lot 6 Click Score: 15   End of Session Equipment Utilized During Treatment: Gait belt;Oxygen Nurse Communication: Mobility status  Activity Tolerance: Patient limited by lethargy Patient left: in bed;with call bell/phone within reach;with bed alarm set  OT Visit  Diagnosis: Unsteadiness on feet (R26.81);Muscle weakness (generalized) (M62.81)                Time: 5027-7412 OT Time Calculation (min): 29 min Charges:  OT General Charges $OT Visit: 1 Visit OT Evaluation $OT Eval Moderate Complexity: 1 Mod OT Treatments $Self Care/Home Management : 8-22 mins  Flora Lipps, OTR/L Acute Rehabilitation Services Pager: [phone] Office: [phone]   Yuval Nolet C 01/11/2021, 4:36 PM

## 2021-01-11 NOTE — Progress Notes (Signed)
PROGRESS NOTE  Joanne Hardin TMH:[id number] DOB: [birth year] DOA: 01/07/2021 PCP: Home, [facility]  Brief History   80 year old woman with history significant for recent COVID infection and subsequent hypoxemic respiratory failure on high flow nasal cannula and chronic steroids was admitted 01/07/2021 for melenic stool.  Work-up was notable forCT abdomen pelvis showed diverticulitis and a large stool burden.  Patient was admitted for IV antibiotics, transfusion and disimpaction.  Gastroenterology has been consulted. The patient has been disimpacted. They feel that the GI bleed was due to steroral ulcer. They plan to perform limited endoscopy to the rectosigmoid junction prior to discharge.  Consultants  . Gastroenterology  Procedures  . Manual disimpaction  Antibiotics   Anti-infectives (From admission, onward)   Start     Dose/Rate Route Frequency Ordered Stop   01/08/21 2030  cefTRIAXone (ROCEPHIN) 1 g in sodium chloride 0.9 % 100 mL IVPB  Status:  Discontinued        1 g 200 mL/hr over 30 Minutes Intravenous Every 24 hours 01/08/21 1930 01/08/21 1932   01/08/21 2030  metroNIDAZOLE (FLAGYL) IVPB 500 mg        500 mg 100 mL/hr over 60 Minutes Intravenous Every 8 hours 01/08/21 1930     01/08/21 2030  cefTRIAXone (ROCEPHIN) 1 g in sodium chloride 0.9 % 100 mL IVPB        1 g 200 mL/hr over 30 Minutes Intravenous Every 24 hours 01/08/21 1932      .  Subjective  The patient is resting comfortably. Son is at bedside. No new complaints.  Objective   Vitals:  Vitals:   01/11/21 0429 01/11/21 0812  BP: (!) 147/76 (!) 143/72  Pulse: 88 98  Resp: 18 17  Temp: 97.9 F (36.6 C) 98.6 F (37 C)  SpO2: 97% 98%   Exam:  Constitutional:  . The patient is awake and alert. No acute distress. Respiratory:  . No increased work of breathing. . No wheezes, rales, or rhonchi . No tactile fremitus Cardiovascular:  . Regular rate and rhythm . No murmurs, ectopy, or gallups. . No  lateral PMI. No thrills. Abdomen:  . Abdomen is soft, non-tender, non-distended . No hernias, masses, or organomegaly . Normoactive bowel sounds.  Musculoskeletal:  . No cyanosis, clubbing, or edema Skin:  . No rashes, lesions, ulcers . palpation of skin: no induration or nodules Neurologic:  . CN 2-12 intact . Sensation all 4 extremities intact Psychiatric:  . Unable to evaluate as the patient is unable to cooperate with exam. I have personally reviewed the following:   Today's Data  . Vitals, BMP, CBC  Micro Data  . 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 CT abdomen and pelvis  Scheduled Meds: . Chlorhexidine Gluconate Cloth  6 each Topical Q0600  . clotrimazole  1 Applicatorful Vaginal QHS  . feeding supplement  1 Container Oral TID BM  . metoprolol succinate  25 mg Oral Daily  . mupirocin ointment  1 application Nasal BID  . pantoprazole  40 mg Oral Q0600  . polyethylene glycol  17 g Oral Daily  . [START ON 01/13/2021] predniSONE  10 mg Oral Q breakfast  . predniSONE  20 mg Oral Q breakfast   Continuous Infusions: . cefTRIAXone (ROCEPHIN)  IV 1 g (01/10/21 2043)  . metronidazole 500 mg (01/11/21 1512)    Principal Problem:   Acute GI bleeding Active Problems:   Acute respiratory failure due to COVID-19 [facility])   Essential hypertension   Acute blood  loss anemia   LOS: 4 days   A & P  80 year old female with chronic oxygen needs secondary to COVID infection now presents with acute blood loss anemia and diverticulitis.  Acute blood loss anemia: Thought to be diverticular bleed in setting of diverticulitis versus PUD. GI has been following, appreciate input. Patient is status post 2 units PRBC and hemoglobin seems to be stabilized around 9, but will need to follow closely as it may well drift down. GI has been consulted. They plan to scope the patient to the rectosigmoid before she is discharged. Hemoglobin has remained stable at 8.7.  Acute diverticulitis: Continue  ceftriaxone and Flagyl. 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 Patient is on steroid taper such that she is on 20 mg of prednisone from January 23 to 26 and then 10 mg for the next 3 days and then 5 mg and then off. Continue oxygen.  HTN: Blood pressure is holding steady on metoprolol  This patient has been examined and seen by me. I have spent 34 minutes in her evaluation and care.  DVT prophylaxis: SCD Code Status: Full Family Communication: Son was at bedside throughout Disposition Plan:   Patient is from: SNF  Anticipated Discharge Location: LTAC (previously at [facility])  Barriers to Discharge: On IV antibiotics for diverticulitis  Is patient medically stable for Discharge: No  Cliffard Hair, DO [facility] Direct contact: see [email or web address]  7PM-7AM contact night coverage as above 01/11/2021, 4:00 PM  LOS: 4 days

## 2021-01-11 NOTE — Care Management Important Message (Signed)
Important Message  Patient Details  Name: Joanne Hardin MRN: [id number] Date of Birth: December 19, 1941   Medicare Important Message Given:  Yes     Renie Ora 01/11/2021, 3:43 PM

## 2021-01-12 DIAGNOSIS — K5901 Slow transit constipation: Secondary | ICD-10-CM

## 2021-01-12 DIAGNOSIS — K5732 Diverticulitis of large intestine without perforation or abscess without bleeding: Secondary | ICD-10-CM

## 2021-01-12 DIAGNOSIS — K921 Melena: Secondary | ICD-10-CM | POA: Diagnosis not present

## 2021-01-12 LAB — CBC WITH DIFFERENTIAL/PLATELET
Abs Immature Granulocytes: 0.74 10*3/uL — ABNORMAL HIGH (ref 0.00–0.07)
Basophils Absolute: 0.1 10*3/uL (ref 0.0–0.1)
Basophils Relative: 1 %
Eosinophils Absolute: 0.1 10*3/uL (ref 0.0–0.5)
Eosinophils Relative: 1 %
HCT: 31.3 % — ABNORMAL LOW (ref 36.0–46.0)
Hemoglobin: 10.2 g/dL — ABNORMAL LOW (ref 12.0–15.0)
Immature Granulocytes: 6 %
Lymphocytes Relative: 15 %
Lymphs Abs: 1.9 10*3/uL (ref 0.7–4.0)
MCH: 30.7 pg (ref 26.0–34.0)
MCHC: 32.6 g/dL (ref 30.0–36.0)
MCV: 94.3 fL (ref 80.0–100.0)
Monocytes Absolute: 1.1 10*3/uL — ABNORMAL HIGH (ref 0.1–1.0)
Monocytes Relative: 9 %
Neutro Abs: 9 10*3/uL — ABNORMAL HIGH (ref 1.7–7.7)
Neutrophils Relative %: 68 %
Platelets: 417 10*3/uL — ABNORMAL HIGH (ref 150–400)
RBC: 3.32 MIL/uL — ABNORMAL LOW (ref 3.87–5.11)
RDW: 16.4 % — ABNORMAL HIGH (ref 11.5–15.5)
WBC: 13.1 10*3/uL — ABNORMAL HIGH (ref 4.0–10.5)
nRBC: 0.2 % (ref 0.0–0.2)

## 2021-01-12 LAB — BASIC METABOLIC PANEL
Anion gap: 10 (ref 5–15)
BUN: 5 mg/dL — ABNORMAL LOW (ref 8–23)
CO2: 27 mmol/L (ref 22–32)
Calcium: 8.6 mg/dL — ABNORMAL LOW (ref 8.9–10.3)
Chloride: 99 mmol/L (ref 98–111)
Creatinine, Ser: 0.42 mg/dL — ABNORMAL LOW (ref 0.44–1.00)
GFR, Estimated: >60 mL/min (ref >60)
Glucose, Bld: 82 mg/dL (ref 70–99)
Potassium: 3.8 mmol/L (ref 3.5–5.1)
Sodium: 136 mmol/L (ref 135–145)

## 2021-01-12 LAB — GLUCOSE, CAPILLARY
Glucose-Capillary: 108 mg/dL — ABNORMAL HIGH (ref 70–99)
Glucose-Capillary: 76 mg/dL (ref 70–99)

## 2021-01-12 MED ORDER — ORAL CARE MOUTH RINSE
15.0000 mL | Freq: Two times a day (BID) | OROMUCOSAL | Status: DC
  Administered 2021-01-12 – 2021-01-15 (×7): 15 mL via OROMUCOSAL

## 2021-01-12 NOTE — Progress Notes (Signed)
Physical Therapy Treatment Patient Details Name: Joanne Hardin MRN: [id number] DOB: 1941/10/06 Today's Date: 01/12/2021    History of Present Illness Pt is a 80 y.o. female with a PMH significant for HTN, hyperlipidemia, and recent COVID hospitalization now at [facility] facility for O2 weaning (on 40% FiO2) who presents with lower abdominal pain, bloody stools, decrease in hemoglobin, and GI bleed.    PT Comments    Pt supine in bed this session.  Pt performed sit to stand x 2 trials with Mod assist +2.  Performed 2 steps laterally this session. SPo2 remains to desat with activity.  Pt required 1L at rest > 90%, required 5L with activity.      Follow Up Recommendations  SNF;Supervision/Assistance - 24 hour     Equipment Recommendations   (defer to next venue)    Recommendations for Other Services       Precautions / Restrictions Precautions Precautions: Fall Precaution Comments: monitor SpO2; HOH Restrictions Weight Bearing Restrictions: No    Mobility  Bed Mobility Overal bed mobility: Needs Assistance Bed Mobility: Supine to Sit;Sit to Supine     Supine to sit: Min assist;HOB elevated Sit to supine: Min assist;HOB elevated   General bed mobility comments: Assistance to lift B LEs back to bed, assistance to elevate trunk into a seated position.  Transfers Overall transfer level: Needs assistance Equipment used: None Transfers: Sit to/from Stand Sit to Stand: Mod assist;+2 safety/equipment         General transfer comment: Cues for sequencing and hand placement.  Moderate assistance to boost into standing.  Pt performed x 2 at the edge of the bed.  Ambulation/Gait Ambulation/Gait assistance: Mod assist;+2 physical assistance Gait Distance (Feet): 2 Feet (two side steps to the L to [facility] Assistive device: Rolling walker (2 wheeled) Gait Pattern/deviations: Shuffle;Trunk flexed     General Gait Details: Pt require mod assistance to move to the L.  Pt required  assistance to shirt weight and manage RW.   Stairs             Wheelchair Mobility    Modified Rankin (Stroke Patients Only)       Balance Overall balance assessment: Needs assistance Sitting-balance support: Bilateral upper extremity supported;Feet supported Sitting balance-Leahy Scale: Poor       Standing balance-Leahy Scale: Poor                              Cognition Arousal/Alertness: Awake/alert Behavior During Therapy: WFL for tasks assessed/performed Overall Cognitive Status: Within Functional Limits for tasks assessed                                 General Comments: A&Ox4.      Exercises General Exercises - Lower Extremity Long Arc Quad: AROM;Both;10 reps;Seated Hip Flexion/Marching: AROM;Both;10 reps;Seated    General Comments        Pertinent Vitals/Pain Pain Assessment: No/denies pain    Home Living                      Prior Function            PT Goals (current goals can now be found in the care plan section) Acute Rehab PT Goals Patient Stated Goal: per son "to go to SNF and then be well enough to go home." Potential to Achieve Goals: Fair Progress towards PT goals:  Progressing toward goals    Frequency    Min 3X/week      PT Plan Current plan remains appropriate    Co-evaluation              AM-PAC PT "6 Clicks" Mobility   Outcome Measure  Help needed turning from your back to your side while in a flat bed without using bedrails?: A Little Help needed moving from lying on your back to sitting on the side of a flat bed without using bedrails?: A Little Help needed moving to and from a bed to a chair (including a wheelchair)?: A Lot Help needed standing up from a chair using your arms (e.g., wheelchair or bedside chair)?: A Lot Help needed to walk in hospital room?: A Lot Help needed climbing 3-5 steps with a railing? : Total 6 Click Score: 13    End of Session Equipment  Utilized During Treatment: Gait belt Activity Tolerance: Patient limited by fatigue;Treatment limited secondary to medical complications (Comment) Patient left: in bed;with call bell/phone within reach;with bed alarm set Nurse Communication: Mobility status;Other (comment) (SPO2 levels.  Coughing up thick phlegm) PT Visit Diagnosis: Muscle weakness (generalized) (M62.81);Difficulty in walking, not elsewhere classified (R26.2)     Time: 9407-6808 PT Time Calculation (min) (ACUTE ONLY): 31 min  Charges:  $Therapeutic Activity: 23-37 mins                     Joanne Hardin , PTA Acute Rehabilitation Services Pager [phone] Office [phone]     Joanne Hardin Artis Delay 01/12/2021, 3:09 PM

## 2021-01-12 NOTE — TOC Transition Note (Signed)
Transition of Care [facility]) - CM/SW Discharge Note   Patient Details  Name: Joanne Hardin MRN: [id number] Date of Birth: [birth year]  Transition of Care [facility]) CM/SW Contact:  [name], LCSWA Phone Number: 01/12/2021, 3:51 PM   Clinical Narrative:     CSW visit with patient and her son, [name] at bedside. CSW introduced self and explained role. [name] advised family prefer the patient to be transferred to a facility closer to [place]. CSW explained the SNF process. CSW explained possible up front transportation cost beyond 50 miles radius from the hospital to facility/SNF. He states understanding and advised he will discuss with patient's daughter,Theresa. CSW provided medicare.gov listing of facilities in [place]. He will review facilities with family and informed CSW of top 3 choices. Patient is not vaccinated. No questions or concerns at this time.   CSW will continue to follow and assist with discharge planning.  [name], MSW, LCSW Clinical Social Worker   Final next level of care: Skilled Nursing Facility Barriers to Discharge: Continued Medical Work up,SNF Pending bed offer   Patient Goals and CMS Choice Patient states their goals for this hospitalization and ongoing recovery are:: rehab/LTACH CMS Medicare.gov Compare Post Acute Care list provided to:: Patient Represenative (must comment) Choice offered to / list presented to : Adult Children  Discharge Placement                       Discharge Plan and Services   Discharge Planning Services: CM Consult Post Acute Care Choice: Long Term Acute Care (LTAC)                               Social Determinants of Health (SDOH) Interventions     Readmission Risk Interventions No flowsheet data found.

## 2021-01-12 NOTE — Progress Notes (Signed)
PROGRESS NOTE  Joanne Hardin RPR:[id number] DOB: 08/15/1941 DOA: 01/07/2021 PCP: Home, [facility]  Brief History   80 year old woman with history significant for recent COVID infection and subsequent hypoxemic respiratory failure on high flow nasal cannula and chronic steroids was admitted 01/07/2021 for melenic stool.  Work-up was notable forCT abdomen pelvis showed diverticulitis and a large stool burden.  Patient was admitted for IV antibiotics, transfusion and disimpaction.  Gastroenterology has been consulted. The patient has been disimpacted. They feel that the GI bleed was due to steroral ulcer. They plan to perform limited endoscopy to the rectosigmoid junction prior to discharge, but prefer to perform the procedure after the patient's respiratory status has returned to baseline. We are weaning her O2 to off.  Consultants  . Gastroenterology  Procedures  . Manual disimpaction  Antibiotics   Anti-infectives (From admission, onward)   Start     Dose/Rate Route Frequency Ordered Stop   01/08/21 2030  cefTRIAXone (ROCEPHIN) 1 g in sodium chloride 0.9 % 100 mL IVPB  Status:  Discontinued        1 g 200 mL/hr over 30 Minutes Intravenous Every 24 hours 01/08/21 1930 01/08/21 1932   01/08/21 2030  metroNIDAZOLE (FLAGYL) IVPB 500 mg        500 mg 100 mL/hr over 60 Minutes Intravenous Every 8 hours 01/08/21 1930     01/08/21 2030  cefTRIAXone (ROCEPHIN) 1 g in sodium chloride 0.9 % 100 mL IVPB        1 g 200 mL/hr over 30 Minutes Intravenous Every 24 hours 01/08/21 1932       Subjective  The patient is resting comfortably. Son is at bedside. No new complaints.  Objective   Vitals:  Vitals:   01/12/21 0822 01/12/21 0900  BP: 127/89   Pulse: 99   Resp: 16   Temp: 98.6 F (37 C)   SpO2: 96% 97%   Exam:  Constitutional:  . The patient is awake and alert. No acute distress. Respiratory:  . No increased work of breathing. . No wheezes, rales, or rhonchi . No tactile  fremitus Cardiovascular:  . Regular rate and rhythm . No murmurs, ectopy, or gallups. . No lateral PMI. No thrills. Abdomen:  . Abdomen is soft, non-tender, non-distended . No hernias, masses, or organomegaly . Normoactive bowel sounds.  Musculoskeletal:  . No cyanosis, clubbing, or edema Skin:  . No rashes, lesions, ulcers . palpation of skin: no induration or nodules Neurologic:  . CN 2-12 intact . Sensation all 4 extremities intact Psychiatric:  . Unable to evaluate as the patient is unable to cooperate with exam. I have personally reviewed the following:   Today's Data  . Vitals, BMP, CBC  Micro Data  . 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 CT abdomen and pelvis  Scheduled Meds: . Chlorhexidine Gluconate Cloth  6 each Topical Q0600  . clotrimazole  1 Applicatorful Vaginal QHS  . feeding supplement  1 Container Oral TID BM  . mouth rinse  15 mL Mouth Rinse BID  . metoprolol succinate  25 mg Oral Daily  . mupirocin ointment  1 application Nasal BID  . pantoprazole  40 mg Oral Q0600  . polyethylene glycol  17 g Oral Daily  . [START ON 01/13/2021] predniSONE  10 mg Oral Q breakfast   Continuous Infusions: . cefTRIAXone (ROCEPHIN)  IV Stopped (01/11/21 2113)  . metronidazole 500 mg (01/12/21 1500)    Principal Problem:   Acute GI bleeding Active Problems:  Acute respiratory failure due to COVID-19 [facility])   Essential hypertension   Acute blood loss anemia   Hematochezia   Slow transit constipation   Diverticulitis of colon   LOS: 5 days   A & P  80 year old female with chronic oxygen needs secondary to COVID infection now presents with acute blood loss anemia and diverticulitis.  Acute blood loss anemia: Thought to be diverticular bleed in setting of diverticulitis versus PUD. GI has been following, appreciate input. Patient is status post 1 unit of PRBC. GI was been consulted. They plan to scope the patient to the rectosigmoid before she is discharged, but after  her respiratory status has returned to baseline. Hemoglobin has remained stable at 10.2.  Acute diverticulitis: Continue ceftriaxone and Flagyl. Leukocytosis is increasing due to the effect of steroids. Pain is improved. Monitor WBC as steroids are tapered to off.  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 Patient is on steroid taper such that she is on 20 mg of prednisone from January 23 to 26 and then 10 mg for the next 3 days and then 5 mg and then off. Continue oxygen.  HTN: Blood pressure is holding steady on metoprolol  This patient has been examined and seen by me. I have spent 32 minutes in her evaluation and care.  DVT prophylaxis: SCD Code Status: Full Family Communication: Son was at bedside throughout Disposition Plan:   Patient is from: SNF  Anticipated Discharge Location: LTAC (previously at [facility])  Barriers to Discharge: On IV antibiotics for diverticulitis  Is patient medically stable for Discharge: No  Joanne Swayze, DO [facility] Direct contact: see [email or web address]  7PM-7AM contact night coverage as above 01/12/2021, 3:30 PM  LOS: 4 days

## 2021-01-13 DIAGNOSIS — D62 Acute posthemorrhagic anemia: Secondary | ICD-10-CM | POA: Diagnosis not present

## 2021-01-13 DIAGNOSIS — K5901 Slow transit constipation: Secondary | ICD-10-CM | POA: Diagnosis not present

## 2021-01-13 DIAGNOSIS — K922 Gastrointestinal hemorrhage, unspecified: Secondary | ICD-10-CM

## 2021-01-13 DIAGNOSIS — I10 Essential (primary) hypertension: Secondary | ICD-10-CM

## 2021-01-13 DIAGNOSIS — K5641 Fecal impaction: Secondary | ICD-10-CM

## 2021-01-13 DIAGNOSIS — K5732 Diverticulitis of large intestine without perforation or abscess without bleeding: Secondary | ICD-10-CM | POA: Diagnosis not present

## 2021-01-13 DIAGNOSIS — R935 Abnormal findings on diagnostic imaging of other abdominal regions, including retroperitoneum: Secondary | ICD-10-CM

## 2021-01-13 LAB — CBC WITH DIFFERENTIAL/PLATELET
Abs Immature Granulocytes: 0.78 10*3/uL — ABNORMAL HIGH (ref 0.00–0.07)
Basophils Absolute: 0.1 10*3/uL (ref 0.0–0.1)
Basophils Relative: 1 %
Eosinophils Absolute: 0.1 10*3/uL (ref 0.0–0.5)
Eosinophils Relative: 1 %
HCT: 28.7 % — ABNORMAL LOW (ref 36.0–46.0)
Hemoglobin: 9.5 g/dL — ABNORMAL LOW (ref 12.0–15.0)
Immature Granulocytes: 5 %
Lymphocytes Relative: 14 %
Lymphs Abs: 2.1 10*3/uL (ref 0.7–4.0)
MCH: 30.7 pg (ref 26.0–34.0)
MCHC: 33.1 g/dL (ref 30.0–36.0)
MCV: 92.9 fL (ref 80.0–100.0)
Monocytes Absolute: 1.2 10*3/uL — ABNORMAL HIGH (ref 0.1–1.0)
Monocytes Relative: 8 %
Neutro Abs: 10.5 10*3/uL — ABNORMAL HIGH (ref 1.7–7.7)
Neutrophils Relative %: 71 %
Platelets: 397 10*3/uL (ref 150–400)
RBC: 3.09 MIL/uL — ABNORMAL LOW (ref 3.87–5.11)
RDW: 16.3 % — ABNORMAL HIGH (ref 11.5–15.5)
WBC: 14.7 10*3/uL — ABNORMAL HIGH (ref 4.0–10.5)
nRBC: 0.4 % — ABNORMAL HIGH (ref 0.0–0.2)

## 2021-01-13 LAB — GLUCOSE, CAPILLARY
Glucose-Capillary: 105 mg/dL — ABNORMAL HIGH (ref 70–99)
Glucose-Capillary: 226 mg/dL — ABNORMAL HIGH (ref 70–99)
Glucose-Capillary: 190 mg/dL — ABNORMAL HIGH (ref 70–99)

## 2021-01-13 MED ORDER — NITROGLYCERIN 0.4 MG SL SUBL: 0.4000 mg | SUBLINGUAL_TABLET | SUBLINGUAL | Status: DC | PRN

## 2021-01-13 MED ORDER — MORPHINE SULFATE (PF) 2 MG/ML IV SOLN: 2.0000 mg | INTRAVENOUS | Status: DC | PRN

## 2021-01-13 MED ORDER — POLYETHYLENE GLYCOL 3350 17 G PO PACK
17.0000 g | PACK | ORAL | Status: DC
  Filled 2021-01-13: qty 1

## 2021-01-13 NOTE — Progress Notes (Addendum)
Daily Rounding Note  01/13/2021, 11:03 AM  LOS: 6 days   SUBJECTIVE:   Chief complaint: Fecal impaction.  Perforated sigmoid diverticulitis.    From a GI standpoint patient is doing well.  She has some residual abdominal pain and tenderness but it is not severe and much improved from several days ago.  Tolerating solid food.  Stools are loose, brown.  She had 3 this morning. Patient son tells me that a physician came in this morning and said that the patient was going to have a colonoscopy on Friday.  However this is incorrect.  At present Dr. Marina Goodell plans no procedures. The son tells me that in addition to the colonoscopy in 05/2019, he thinks that the patient had a colonoscopy in the fall 2021 as well.  OBJECTIVE:         Vital signs in last 24 hours:    Temp:  [98.9 F (37.2 C)-99.3 F (37.4 C)] 99.3 F (37.4 C) (01/26 0812) Pulse Rate:  [88-106] 97 (01/26 0812) Resp:  [19-20] 20 (01/26 0812) BP: (123-150)/(65-83) 138/67 (01/26 0812) SpO2:  [93 %-95 %] 94 % (01/26 0812) Last BM Date: 01/13/21 Filed Weights   01/07/21 2143  Weight: 77.1 kg   General: Comfortable.  Does not look acutely ill. Heart: RRR. Chest: No labored breathing.  No cough. Abdomen: Multiple medium sized bruises in various states of healing in a crescent pattern in the lower abdomen running bilaterally from the iliac crests. Extremities: Slight, nonpitting pedal edema. Neuro/Psych: Alert.  Very hard of hearing.  Oriented x3.  Intake/Output from previous day: 01/25 0701 - 01/26 0700 In: -  Out: 375 [Urine:375]  Intake/Output this shift: Total I/O In: -  Out: 625 [Urine:625]  Lab Results: Recent Labs    01/11/21 0132 01/12/21 0158 01/13/21 0049  WBC 10.9* 13.1* 14.7*  HGB 8.7* 10.2* 9.5*  HCT 27.2* 31.3* 28.7*  PLT 466* 417* 397   BMET Recent Labs    01/11/21 0132 01/12/21 0158  NA 136 136  K 3.2* 3.8  CL 101 99  CO2 28 27   GLUCOSE 96 82  BUN 9 5*  CREATININE 0.40* 0.42*  CALCIUM 8.3* 8.6*   LFT No results for input(s): PROT, ALBUMIN, AST, ALT, ALKPHOS, BILITOT, BILIDIR, IBILI in the last 72 hours. PT/INR No results for input(s): LABPROT, INR in the last 72 hours. Hepatitis Panel No results for input(s): HEPBSAG, HCVAB, HEPAIGM, HEPBIGM in the last 72 hours.  Studies/Results: No results found.   Scheduled Meds: . Chlorhexidine Gluconate Cloth  6 each Topical Q0600  . feeding supplement  1 Container Oral TID BM  . mouth rinse  15 mL Mouth Rinse BID  . metoprolol succinate  25 mg Oral Daily  . mupirocin ointment  1 application Nasal BID  . pantoprazole  40 mg Oral Q0600  . predniSONE  10 mg Oral Q breakfast   Continuous Infusions: . cefTRIAXone (ROCEPHIN)  IV 1 g (01/12/21 2034)  . metronidazole 500 mg (01/13/21 0615)   PRN Meds:.acetaminophen **OR** acetaminophen, morphine injection, nitroGLYCERIN   ASSESMENT:   *   GI bleed in setting of fecal impaction.  Suspect stercoral ulcer.  Bleeding has been resolved now for several days. Adjusting MiraLAX doses as she had quite a bit of diarrhea initially after MiraLAX prep and then daily MiraLAX.  *   Normocytic anemia.  Presented with blood loss anemia. Hgb 7.2 >> 9.5.  1 PRBC on 1/21.    *  Sigmoid diverticulitis with abscess and contained perforation. Day 6 Rocephin, Flagyl. Patient up-to-date on colonoscopies (latest 05/2019) performed near her home in [place], [place].  *    Longstanding dysphagia.  *   COVID-19 pneumonia 11/2019.  [facility]charged to [facility] for wean off high flow oxygen.  Currently on prednisone.  PLAN   *   ? Duration of abx, 10 vs 14 d, will clarify w Dr Marina Goodell.  Could probably switch to Augmentin fairly soon.  *    At this point, there is no need/plans for flexible sigmoidoscopy or colonoscopy since 1) GI bleeding has resolved, 2) patient at higher risk for perforation given the sigmoid diverticulitis, 3) she is  up-to-date on surveillance/screening colonoscopies. The thinking behind this was discussed with the patient's son and he understands and is agreeable.  Mrs. Opal Sidles is also happy because she did not want to have a colonoscopy.  *   Will restart MiraLAX but give it every other day.  Dosing of MiraLAX is a work in progress and may need further tweaking.  *    Dysphagia 2 diet which is her usual diet PTA.  *    Follow-up with Dr. Bernita Raisin her GI in [place] for GI needs.  Would suggest she follow-up with him for investigation of dysphagia and follow-up of her diverticulitis  *   GI signing off.  Call if questions or concerns.  Jennye Moccasin  01/13/2021, 11:03 AM Phone [phone]  GI ATTENDING  Interval history data reviewed.  Agree with interval progress note as outlined above.  IMPRESSION: 1.  Perforated diverticulitis.  Stable on antibiotics.  Seen by GI but not surgery.  Abdominal pain improved.  Tolerating diet.  Mild leukocytosis noted. 2.  History of colon polyps.  Multiple prior colonoscopies elsewhere.  Last examination less than 2 years ago 3.  Rectal bleeding in the face of fecal impaction.  Problem most consistent with stercoral ulceration.  No further problems since impaction has been addressed 4.  COVID-19 pneumonia.  December 2020.  Severe.  Improving.  On prednisone.  Recommendations: 1.  After completing inpatient course of antibiotics, I would recommend that you discharge home on Augmentin 875 mg twice daily for 2 weeks 2.  She should follow-up with her primary gastroenterologist (Dr. Bernita Raisin) within 2 weeks after discharge.  They may want to perform follow-up CT imaging to assure resolution of contained diverticular abscess.  Thereafter, once the problem has resolved, if she needs follow-up colonoscopy, her primary GI physician (who knows her best) can decide on the timing.  Please call for questions or clarifications.  We will sign off.  Wilhemina Bonito. Eda Keys., M.D. [facility] Division of Gastroenterology

## 2021-01-13 NOTE — [place] FL2 (Signed)
[place] MEDICAID FL2 LEVEL OF CARE SCREENING TOOL     IDENTIFICATION  Patient Name: Joanne Hardin Birthdate: [birth year] Sex: female Admission Date (Current Location): 01/07/2021  [place] and [place] Number:   ([place])   Facility and Address:  [facility], [address] [place], [place] [address]      Provider Number: [id number]  Attending Physician Name and Address:  Marinda Elk, MD  Relative Name and Phone Number:       Current Level of Care: Hospital Recommended Level of Care: Skilled Nursing Facility Prior Approval Number:    Date Approved/Denied:   PASRR Number:    Discharge Plan: SNF    Current Diagnoses: Patient Active Problem List   Diagnosis Date Noted  . Lower GI bleed   . Fecal impaction (HCC)   . Abnormal CT of the abdomen   . Hematochezia   . Slow transit constipation   . Diverticulitis of colon   . Acute respiratory failure due to COVID-19 (HCC) 01/08/2021  . Essential hypertension 01/08/2021  . Acute blood loss anemia 01/08/2021  . Acute GI bleeding 01/07/2021    Orientation RESPIRATION BLADDER Height & Weight     Self,Time,Situation,Place  O2 External catheter,Continent Weight: 170 lb (77.1 kg) Height:  5\' 4"  (162.6 cm)  BEHAVIORAL SYMPTOMS/MOOD NEUROLOGICAL BOWEL NUTRITION STATUS      Incontinent Diet (please see discharge summary)  AMBULATORY STATUS COMMUNICATION OF NEEDS Skin   Limited Assist Verbally Normal                       Personal Care Assistance Level of Assistance  Bathing,Feeding,Dressing Bathing Assistance: Limited assistance Feeding assistance: Independent Dressing Assistance: Limited assistance     Functional Limitations Info  Hearing,Sight,Speech Sight Info: Impaired (LF Eye Blind) Hearing Info: Impaired Speech Info: Adequate    SPECIAL CARE FACTORS FREQUENCY  PT (By licensed PT),OT (By licensed OT)     PT Frequency: 5x per week OT Frequency:  5x per week            Contractures Contractures Info: Not present    Additional Factors Info  Code Status,Allergies,Isolation Precautions Code Status Info: FULL Allergies Info: NKA     Isolation Precautions Info: MRSA     Current Medications (01/13/2021):  This is the current hospital active medication list Current Facility-Administered Medications  Medication Dose Route Frequency Provider Last Rate Last Admin  . acetaminophen (TYLENOL) tablet 650 mg  650 mg Oral Q6H PRN 01/15/2021, MD   650 mg at 01/10/21 2039   Or  . acetaminophen (TYLENOL) suppository 650 mg  650 mg Rectal Q6H PRN 2040, MD      . cefTRIAXone (ROCEPHIN) 1 g in sodium chloride 0.9 % 100 mL IVPB  1 g Intravenous Q24H Rai, Ripudeep K, MD 200 mL/hr at 01/12/21 2034 1 g at 01/12/21 2034  . Chlorhexidine Gluconate Cloth 2 % PADS 6 each  6 each Topical Q0600 Rai, 2035, MD   6 each at 01/11/21 [phone]  . feeding supplement (BOOST / RESOURCE BREEZE) liquid 1 Container  1 Container Oral TID BM 4536, MD   1 Container at 01/13/21 0935  . MEDLINE mouth rinse  15 mL Mouth Rinse BID Swayze, Ava, DO   15 mL at 01/13/21 0935  . metoprolol succinate (TOPROL-XL) 24 hr tablet 25 mg  25 mg Oral Daily 01/15/21, MD   25 mg at 01/13/21  0935  . metroNIDAZOLE (FLAGYL) IVPB 500 mg  500 mg Intravenous Q8H Mansy, Jan A, MD 100 mL/hr at 01/13/21 1355 500 mg at 01/13/21 1355  . morphine 2 MG/ML injection 2 mg  2 mg Intravenous Q2H PRN Mansy, Jan A, MD      . mupirocin ointment (BACTROBAN) 2 % 1 application  1 application Nasal BID Rai, Ripudeep K, MD   1 application at 01/13/21 0935  . nitroGLYCERIN (NITROSTAT) SL tablet 0.4 mg  0.4 mg Sublingual Q5 min PRN Mansy, Jan A, MD      . pantoprazole (PROTONIX) EC tablet 40 mg  40 mg Oral Q0600 Dianah Field, PA-C   40 mg at 01/13/21 1937  . [START ON 01/14/2021] polyethylene glycol (MIRALAX / GLYCOLAX) packet 17 g  17 g Oral QODAY Gribbin,  Sarah J, PA-C      . predniSONE (DELTASONE) tablet 10 mg  10 mg Oral Q breakfast Westley Chandler, MD   10 mg at 01/13/21 9024     Discharge Medications: Please see discharge summary for a list of discharge medications.  Relevant Imaging Results:  Relevant Lab Results:   Additional Information patinet has not received covid vaccines  [name], LCSWA

## 2021-01-13 NOTE — TOC Progression Note (Signed)
Transition of Care [facility]) - Progression Note    Patient Details  Name: Joanne Hardin MRN: [id number] Date of Birth: May 05, 1941  Transition of [facility]) CM/SW Contact  Joanne Hardin, [place] Phone Number: 01/13/2021, 2:50 PM  Clinical Narrative:     CSW contacted per family request:  [facility] - no unvaccinated availability  [facility] [facility] voice message - no response  [facility]- no availability  Psychologist, clinical and [facility]- offered bed and confirmed availability   Estimate cost for tranportation: PTAR estimate cost 2140.00 [facility] 1600.00  CSW contacted patient's son,Joanne Hardin- informed of bed offer with [name]- family accepted bed offer and selected [facility] for transportation. CSW advised must secure payment for transportation in advance.   CSW awaiting d/c readiness-CSW will continue to follow and assist with discharge planning.   Joanne Hardin, MSW, LCSW Clinical Social Worker    Expected Discharge Plan: Skilled Nursing Facility Barriers to Discharge: Continued Medical Work up,SNF Pending bed offer  Expected Discharge Plan and Services Expected Discharge Plan: Skilled Nursing Facility   Discharge Planning Services: CM Consult Post Acute Care Choice: Long Term Acute Care (LTAC) Living arrangements for the past 2 months: Post-Acute Facility                                       Social Determinants of Health (SDOH) Interventions    Readmission Risk Interventions No flowsheet data found.

## 2021-01-13 NOTE — Progress Notes (Signed)
[facility] PROGRESS NOTE    Progress Note  Joanne Hardin  MWN:[id number] DOB: Jun 08, 1941 DOA: 01/07/2021 PCP: [facility]     Brief Narrative:   Joanne Hardin is an 80 y.o. female essential hypertension and hyperlipidemia admitted on December 10, 2020 for COVID-19 infection for which she was difficult to wean off oxygen and subsequently discharged to [facility] comes back into the hospital for melanotic stool to start her on 01/07/2021.  CT scan of the abdomen pelvis showed diverticulitis and lower stool burden.  We will start empirically on antibiotics transfusion and disimpaction. Gastroenterology was consulted and they relate GI bleed was probably due to steroids.  Requiring the limited colonoscopy rectosigmoid prior to discharge.  Blood preferred to do the procedure after the patient respiratory status has returned to baseline.  We will try to wean him off the oxygen.  Assessment/Plan:   Acute GI bleeding/acute blood loss anemia: In the setting of diverticular bleed versus peptic ulcer disease. He was transfused 1 unit of packed red blood cells. GI recommended to do rectosigmoid evaluation before discharge, after his respiratory status has returned to baseline. His hemoglobin this morning is 9.5 we will continue to trend intermittently.  Questionable acute diverticulitis: CT scan on 01/08/2021 showed inflammatory stranding of the sigmoid colon consistent with diverticulitis and a 2.4 cm focal inflammatory mass. He was started empirically on admission Rocephin and Flagyl, exact dose was was increasing likely due to steroids. Plan for flex sig before discharge.  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  Yesterday morning he was weaned to down 1 L overnight back on 3 question undiagnosed obstructive sleep apnea.  We will continue to try to wean to room air. To proceed with colonoscopy hopefully this  week. She has untreated sleep apnea, that is why her oxygen requirements increased overnight, we need to wean her off oxygen so in the morning when she wakes up we could try to wean her to room air as much as possible. She will desat overnight as she has untreated obstructive sleep apnea she has machine at home she does not like to use.  Essential hypertension: Continue metoprolol blood pressure stable.   DVT prophylaxis: scd Family Communication:none Status is: Inpatient  Remains inpatient appropriate because:Hemodynamically unstable   Dispo:  Patient From: Skilled Nursing Facility  Planned Disposition: Skilled Nursing Facility  Expected discharge date: 01/15/2021  Medically stable for discharge: No          Code Status:     Code Status Orders  (From admission, onward)         Start     Ordered   01/08/21 0224  Full code  Continuous        01/08/21 0225        Code Status History    This patient has a current code status but no historical code status.   Advance Care Planning Activity        IV Access:    Peripheral IV   Procedures and diagnostic studies:   No results found.   Medical Consultants:    None.  Anti-Infectives:   Rocephin and Flagyl  Subjective:    Joanne Hardin she relates she is having multiple bowel movements nonbloody at all, she had an episode of hemoptysis yesterday none since then.  Objective:    Vitals:   01/12/21 2052 01/12/21 2349 01/13/21 0319 01/13/21 0812  BP: (!) 142/83 (!) 148/65 (!) 150/71  138/67  Pulse: 88 93 93 97  Resp: 20 19 19 20   Temp: 98.9 F (37.2 C) 98.9 F (37.2 C) 98.9 F (37.2 C) 99.3 F (37.4 C)  TempSrc: Oral Oral Oral Oral  SpO2: 95% 94% 95% 94%  Weight:      Height:       SpO2: 94 % O2 Flow Rate (L/min): 3 L/min   Intake/Output Summary (Last 24 hours) at 01/13/2021 1006 Last data filed at 01/13/2021 0434 Gross per 24 hour  Intake --  Output 375 ml  Net -375 ml   Filed  Weights   01/07/21 2143  Weight: 77.1 kg    Exam: General exam: In no acute distress. Respiratory system: Good air movement and clear to auscultation. Cardiovascular system: S1 & S2 heard, RRR. No JVD. Gastrointestinal system: Abdomen is nondistended, soft and nontender.  Extremities: No pedal edema. Skin: No rashes, lesions or ulcers Psychiatry: Judgement and insight appear normal. Mood & affect appropriate.    Data Reviewed:    Labs: Basic Metabolic Panel: Recent Labs  Lab 01/08/21 0615 01/09/21 1600 01/10/21 0210 01/11/21 0132 01/12/21 0158  NA 134* 135 136 136 136  K 4.1 4.5 3.5 3.2* 3.8  CL 95* 99 102 101 99  CO2 31 28 25 28 27   GLUCOSE 95 147* 85 96 82  BUN 16 10 12 9  5*  CREATININE 0.48 0.50 0.53 0.40* 0.42*  CALCIUM 9.0 8.8* 8.6* 8.3* 8.6*   GFR Estimated Creatinine Clearance: 59.2 mL/min (A) (by C-G formula based on SCr of 0.42 mg/dL (L)). Liver Function Tests: Recent Labs  Lab 01/07/21 2148  AST 19  ALT 40  ALKPHOS 61  BILITOT 0.6  PROT 4.4*  ALBUMIN 2.1*   No results for input(s): LIPASE, AMYLASE in the last 168 hours. No results for input(s): AMMONIA in the last 168 hours. Coagulation profile Recent Labs  Lab 01/07/21 2148 01/08/21 0615  INR 1.0 0.9   COVID-19 Labs  No results for input(s): DDIMER, FERRITIN, LDH, CRP in the last 72 hours.  Lab Results  Component Value Date   SARSCOV2NAA NEGATIVE 01/08/2021    CBC: Recent Labs  Lab 01/07/21 2148 01/08/21 0615 01/09/21 0203 01/10/21 0210 01/10/21 1424 01/11/21 0132 01/12/21 0158 01/13/21 0049  WBC 15.8*   < > 15.5* 12.8*  --  10.9* 13.1* 14.7*  NEUTROABS 12.2*  --   --   --   --   --  9.0* 10.5*  HGB 7.2*   < > 10.7* 8.8* 9.1* 8.7* 10.2* 9.5*  HCT 22.6*   < > 33.2* 26.3* 28.6* 27.2* 31.3* 28.7*  MCV 92.6   < > 92.5 92.9  --  94.1 94.3 92.9  PLT 517*   < > 578* 509*  --  466* 417* 397   < > = values in this interval not displayed.   Cardiac Enzymes: No results for  input(s): CKTOTAL, CKMB, CKMBINDEX, TROPONINI in the last 168 hours. BNP (last 3 results) No results for input(s): PROBNP in the last 8760 hours. CBG: Recent Labs  Lab 01/11/21 1725 01/11/21 2326 01/12/21 0558 01/12/21 2351 01/13/21 0556  GLUCAP 126* 72 108* 76 105*   D-Dimer: No results for input(s): DDIMER in the last 72 hours. Hgb A1c: No results for input(s): HGBA1C in the last 72 hours. Lipid Profile: No results for input(s): CHOL, HDL, LDLCALC, TRIG, CHOLHDL, LDLDIRECT in the last 72 hours. Thyroid function studies: No results for input(s): TSH, T4TOTAL, T3FREE, THYROIDAB in the last 72 hours.  Invalid input(s): FREET3 Anemia work up: No results for input(s): VITAMINB12, FOLATE, FERRITIN, TIBC, IRON, RETICCTPCT in the last 72 hours. Sepsis Labs: Recent Labs  Lab 01/07/21 2225 01/08/21 0615 01/10/21 0210 01/11/21 0132 01/12/21 0158 01/13/21 0049  WBC  --    < > 12.8* 10.9* 13.1* 14.7*  LATICACIDVEN 1.5  --   --   --   --   --    < > = values in this interval not displayed.   Microbiology Recent Results (from the past 240 hour(s))  SARS Coronavirus 2 by RT PCR (hospital order, performed in [facility] hospital lab) Nasopharyngeal Nasopharyngeal Swab     Status: None   Collection Time: 01/08/21  1:01 AM   Specimen: Nasopharyngeal Swab  Result Value Ref Range Status   SARS Coronavirus 2 NEGATIVE NEGATIVE Final    Comment: (NOTE) SARS-CoV-2 target nucleic acids are NOT DETECTED.  The SARS-CoV-2 RNA is generally detectable in upper and lower respiratory specimens during the acute phase of infection. The lowest concentration of SARS-CoV-2 viral copies this assay can detect is 250 copies / mL. A negative result does not preclude SARS-CoV-2 infection and should not be used as the sole basis for treatment or other patient management decisions.  A negative result may occur with improper specimen collection / handling, submission of specimen other than nasopharyngeal  swab, presence of viral mutation(s) within the areas targeted by this assay, and inadequate number of viral copies (<250 copies / mL). A negative result must be combined with clinical observations, patient history, and epidemiological information.  Fact Sheet for Patients:   BoilerBrush.com.cy  Fact Sheet for Healthcare Providers: https://pope.com/  This test is not yet approved or  cleared by the [place] FDA and has been authorized for detection and/or diagnosis of SARS-CoV-2 by FDA under an Emergency Use Authorization (EUA).  This EUA will remain in effect (meaning this test can be used) for the duration of the COVID-19 declaration under Section 564(b)(1) of the Act, 21 U.S.C. section 360bbb-3(b)(1), unless the authorization is terminated or revoked sooner.  Performed at [facility], [address] [place], [place] [address]   MRSA PCR Screening     Status: Abnormal   Collection Time: 01/08/21  6:37 AM   Specimen: Nasal Mucosa; Nasopharyngeal  Result Value Ref Range Status   MRSA by PCR POSITIVE (A) NEGATIVE Final    Comment:        The GeneXpert MRSA Assay (FDA approved for NASAL specimens only), is one component of a comprehensive MRSA colonization surveillance program. It is not intended to diagnose MRSA infection nor to guide or monitor treatment for MRSA infections. RESULT CALLED TO, READ BACK BY AND VERIFIED WITH: Alroy Bailiff RN 11:15 01/08/21 ([email or web address]) Performed at [facility], [address] [place], [place] [address]      Medications:   . Chlorhexidine Gluconate Cloth  6 each Topical Q0600  . feeding supplement  1 Container Oral TID BM  . mouth rinse  15 mL Mouth Rinse BID  . metoprolol succinate  25 mg Oral Daily  . mupirocin ointment  1 application Nasal BID  . pantoprazole  40 mg Oral Q0600  . polyethylene glycol  17 g Oral Daily  . predniSONE  10 mg Oral Q breakfast   Continuous  Infusions: . cefTRIAXone (ROCEPHIN)  IV 1 g (01/12/21 2034)  . metronidazole 500 mg (01/13/21 0615)      LOS: 6 days   Marinda Elk  [facility]  01/13/2021, 10:06 AM

## 2021-01-14 LAB — GLUCOSE, CAPILLARY
Glucose-Capillary: 104 mg/dL — ABNORMAL HIGH (ref 70–99)
Glucose-Capillary: 118 mg/dL — ABNORMAL HIGH (ref 70–99)
Glucose-Capillary: 130 mg/dL — ABNORMAL HIGH (ref 70–99)
Glucose-Capillary: 140 mg/dL — ABNORMAL HIGH (ref 70–99)
Glucose-Capillary: 76 mg/dL (ref 70–99)

## 2021-01-14 LAB — CBC
HCT: 28.5 % — ABNORMAL LOW (ref 36.0–46.0)
Hemoglobin: 9.5 g/dL — ABNORMAL LOW (ref 12.0–15.0)
MCH: 30.9 pg (ref 26.0–34.0)
MCHC: 33.3 g/dL (ref 30.0–36.0)
MCV: 92.8 fL (ref 80.0–100.0)
Platelets: 374 10*3/uL (ref 150–400)
RBC: 3.07 MIL/uL — ABNORMAL LOW (ref 3.87–5.11)
RDW: 16.9 % — ABNORMAL HIGH (ref 11.5–15.5)
WBC: 16 10*3/uL — ABNORMAL HIGH (ref 4.0–10.5)
nRBC: 0.3 % — ABNORMAL HIGH (ref 0.0–0.2)

## 2021-01-14 LAB — SARS CORONAVIRUS 2 (TAT 6-24 HRS): SARS Coronavirus 2: NEGATIVE

## 2021-01-14 MED ORDER — CHLORHEXIDINE GLUCONATE CLOTH 2 % EX PADS
6.0000 | MEDICATED_PAD | Freq: Every day | CUTANEOUS | Status: DC
  Administered 2021-01-14 – 2021-01-15 (×2): 6 via TOPICAL

## 2021-01-14 MED ORDER — SODIUM CHLORIDE 0.9 % IV SOLN
3.0000 g | Freq: Four times a day (QID) | INTRAVENOUS | Status: DC
  Administered 2021-01-14 – 2021-01-15 (×5): 3 g via INTRAVENOUS
  Filled 2021-01-14: qty 3
  Filled 2021-01-14: qty 8
  Filled 2021-01-14 (×3): qty 3
  Filled 2021-01-14 (×2): qty 8
  Filled 2021-01-14: qty 3

## 2021-01-14 NOTE — Progress Notes (Signed)
Occupational Therapy Treatment Patient Details Name: Joanne Hardin MRN: [id number] DOB: 1941/06/21 Today's Date: 01/14/2021    History of present illness Pt is a 80 y.o. female with a PMH significant for HTN, hyperlipidemia, and recent COVID hospitalization now at [facility] facility for O2 weaning (on 40% FiO2) who presents with lower abdominal pain, bloody stools, decrease in hemoglobin, and GI bleed.   OT comments  Pt making great progress towards acute OT goals. Seen together with PT. Pt able to walk from EOB to sink with +2 min A and rw. DOE  3/4. Seated rest break to recover then able to stand to finish oral care and wash face with min A +2 safety. Increased HFNC O2 from 2L to 5L just prior to OOB activity. Able to return to 2L at end of session. D/c plan remains appropriate.    Follow Up Recommendations  SNF;Supervision/Assistance - 24 hour    Equipment Recommendations  3 in 1 bedside commode    Recommendations for Other Services      Precautions / Restrictions Precautions Precautions: Fall Precaution Comments: monitor SpO2; HOH Restrictions Weight Bearing Restrictions: No       Mobility Bed Mobility Overal bed mobility: Needs Assistance Bed Mobility: Supine to Sit     Supine to sit: Min assist;HOB elevated     General bed mobility comments: extra time and effort. min A to come to full EOB position.  Transfers Overall transfer level: Needs assistance Equipment used: None Transfers: Sit to/from Stand Sit to Stand: Mod assist;+2 safety/equipment         General transfer comment: assist to boost and steady from EOB. Cues for technique. did achieve min A +2 for one sit<>stand.    Balance Overall balance assessment: Needs assistance Sitting-balance support: Bilateral upper extremity supported;Feet supported Sitting balance-Leahy Scale: Fair Sitting balance - Comments: Able to maintain unsupported sitting position during oral care. Fatigues quickly   Standing  balance support: Bilateral upper extremity supported Standing balance-Leahy Scale: Poor Standing balance comment: reliant on external support in standing, able to briefly stand with single extremity support during oral care with min A.                           ADL either performed or assessed with clinical judgement   ADL Overall ADL's : Needs assistance/impaired     Grooming: Minimal assistance;Sitting;Standing;Oral care;Wash/dry face                               Functional mobility during ADLs: Minimal assistance;+2 for physical assistance;+2 for safety/equipment General ADL Comments: fatigued after walking to sink and needed seated rest break. Oral care initiated in seated position but recovered enough to finsh in standing and wash face a bit. min A to steady, reliant on external support in standing. Ended session after grooming tasks, rode chair back into original position.     Vision       Perception     Praxis      Cognition Arousal/Alertness: Awake/alert Behavior During Therapy: WFL for tasks assessed/performed Overall Cognitive Status: Within Functional Limits for tasks assessed                                          Exercises     Shoulder Instructions  General Comments HFNC 2L at rest. Increased to 5L just prior to initiating OOB activity. DOE 3/4, able to recover a bit during seated rest break. Able to come down to 2L at end of session.    Pertinent Vitals/ Pain       Pain Assessment: No/denies pain  Home Living                                          Prior Functioning/Environment              Frequency  Min 2X/week        Progress Toward Goals  OT Goals(current goals can now be found in the care plan section)  Progress towards OT goals: Progressing toward goals  Acute Rehab OT Goals Patient Stated Goal: per son "to go to SNF and then be well enough to go home." OT Goal  Formulation: With patient Time For Goal Achievement: 01/28/21 Potential to Achieve Goals: Good ADL Goals Pt Will Perform Grooming: with set-up;sitting Pt Will Perform Upper Body Dressing: with set-up;sitting Pt Will Perform Lower Body Dressing: sitting/lateral leans;sit to/from stand;with mod assist Pt Will Transfer to Toilet: with min assist;stand pivot transfer;bedside commode Pt Will Perform Toileting - Clothing Manipulation and hygiene: with min assist;sitting/lateral leans;sit to/from stand Pt/caregiver will Perform Home Exercise Program: Increased strength;Both right and left upper extremity;With Supervision  Plan Discharge plan remains appropriate    Co-evaluation    PT/OT/SLP Co-Evaluation/Treatment: Yes Reason for Co-Treatment: For patient/therapist safety;Complexity of the patient's impairments (multi-system involvement);To address functional/ADL transfers   OT goals addressed during session: ADL's and self-care      AM-PAC OT "6 Clicks" Daily Activity     Outcome Measure   Help from another person eating meals?: None Help from another person taking care of personal grooming?: A Little Help from another person toileting, which includes using toliet, bedpan, or urinal?: A Lot Help from another person bathing (including washing, rinsing, drying)?: A Lot Help from another person to put on and taking off regular upper body clothing?: A Little Help from another person to put on and taking off regular lower body clothing?: A Lot 6 Click Score: 16    End of Session Equipment Utilized During Treatment: Gait belt;Rolling walker;Oxygen  OT Visit Diagnosis: Unsteadiness on feet (R26.81);Muscle weakness (generalized) (M62.81)   Activity Tolerance     Patient Left     Nurse Communication          Time: 6720-9470 OT Time Calculation (min): 25 min  Charges: OT General Charges $OT Visit: 1 Visit OT Treatments $Self Care/Home Management : 8-22 mins  Raynald Kemp,  OT Acute Rehabilitation Services Pager: [phone] Office: [phone]    Pilar Grammes 01/14/2021, 1:47 PM

## 2021-01-14 NOTE — Progress Notes (Signed)
Pt o2 sat dropped to 70-80% while sleeping on 1l o2 via [place]. o2 increased to 3l o2 via [place]. Sat maintaining at 95-96%. Will continue to monitor.

## 2021-01-14 NOTE — Progress Notes (Signed)
[facility] PROGRESS NOTE    Progress Note  Joanne Hardin  TML:[id number] DOB: 04/14/1941 DOA: 01/07/2021 PCP: [facility]     Brief Narrative:   Joanne Hardin is an 80 y.o. female essential hypertension and hyperlipidemia admitted on December 10, 2020 for COVID-19 infection for which she was difficult to wean off oxygen and subsequently discharged to [facility] comes back into the hospital for melanotic stool to start her on 01/07/2021.  CT scan of the abdomen pelvis showed diverticulitis and lower stool burden.  We will start empirically on antibiotics transfusion and disimpaction. Gastroenterology was consulted and they relate GI bleed was probably due to steroids.  Requiring the limited colonoscopy rectosigmoid prior to discharge.  Blood preferred to do the procedure after the patient respiratory status has returned to baseline.  We will try to wean him off the oxygen.  Assessment/Plan:   Acute GI bleeding/acute blood loss anemia: In the setting of diverticular bleed versus peptic ulcer disease. He was transfused 1 unit of packed red blood cells. GI recommended flex sig as an outpatient.  Once her respiratory status with turns to baseline. Hemoglobin has remained stable. Physical therapy evaluated the patient recommended skilled nursing facility.  Acute diverticulitis: CT scan on 01/08/2021 showed inflammatory stranding of the sigmoid colon consistent with diverticulitis and a 2.4 cm focal inflammatory mass. He was started empirically on admission Rocephin and Flagyl, exact dose was was increasing likely due to steroids. Her white blood cell count persistently is trending up we will wean off the steroids, will switch her to IV Unasyn Tomorrow morning, she has a temperature of 99 and 01/13/2021.  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   She has obstructive sleep apnea at home she has a machine which she does not use, try  to wean to room air. She has untreated sleep apnea, that is why her oxygen requirements increased overnight, we need to wean her off oxygen so in the morning.  Essential hypertension: Continue metoprolol blood pressure stable.   DVT prophylaxis: scd Family Communication:none Status is: Inpatient  Remains inpatient appropriate because:Hemodynamically unstable   Dispo:  Patient From: Skilled Nursing Facility  Planned Disposition: Skilled Nursing Facility  Expected discharge date: 01/15/2021  Medically stable for discharge: No          Code Status:     Code Status Orders  (From admission, onward)         Start     Ordered   01/08/21 0224  Full code  Continuous        01/08/21 0225        Code Status History    This patient has a current code status but no historical code status.   Advance Care Planning Activity        IV Access:    Peripheral IV   Procedures and diagnostic studies:   No results found.   Medical Consultants:    None.  Anti-Infectives:   Rocephin and Flagyl  Subjective:    Joanne Hardin tolerating her diet is no new complaints no abdominal pain had a bowel movement without any pain.  Objective:    Vitals:   01/13/21 1939 01/14/21 0000 01/14/21 0421 01/14/21 0759  BP: 136/63 (!) 158/75 (!) 147/75 (!) 147/66  Pulse: 97 99 99 91  Resp: (!) 24 20 17 19   Temp: 99.4 F (37.4 C) 98.5 F (36.9 C) 98.2 F (36.8 C) 98.2 F (36.8 C)  TempSrc: Oral Oral  Oral Oral  SpO2: 97% 95% 94% 98%  Weight:      Height:       SpO2: 98 % O2 Flow Rate (L/min): 3 L/min   Intake/Output Summary (Last 24 hours) at 01/14/2021 0848 Last data filed at 01/14/2021 0759 Gross per 24 hour  Intake 860 ml  Output 2825 ml  Net -1965 ml   Filed Weights   01/07/21 2143  Weight: 77.1 kg    Exam: General exam: In no acute distress. Respiratory system: Good air movement and clear to auscultation. Cardiovascular system: S1 & S2 heard, RRR. No  JVD. Gastrointestinal system: Abdomen is nondistended, soft and nontender.  Extremities: No pedal edema. Skin: No rashes, lesions or ulcers Psychiatry: Judgement and insight appear normal. Mood & affect appropriate.   Data Reviewed:    Labs: Basic Metabolic Panel: Recent Labs  Lab 01/08/21 0615 01/09/21 1600 01/10/21 0210 01/11/21 0132 01/12/21 0158  NA 134* 135 136 136 136  K 4.1 4.5 3.5 3.2* 3.8  CL 95* 99 102 101 99  CO2 31 28 25 28 27   GLUCOSE 95 147* 85 96 82  BUN 16 10 12 9  5*  CREATININE 0.48 0.50 0.53 0.40* 0.42*  CALCIUM 9.0 8.8* 8.6* 8.3* 8.6*   GFR Estimated Creatinine Clearance: 59.2 mL/min (A) (by C-G formula based on SCr of 0.42 mg/dL (L)). Liver Function Tests: Recent Labs  Lab 01/07/21 2148  AST 19  ALT 40  ALKPHOS 61  BILITOT 0.6  PROT 4.4*  ALBUMIN 2.1*   No results for input(s): LIPASE, AMYLASE in the last 168 hours. No results for input(s): AMMONIA in the last 168 hours. Coagulation profile Recent Labs  Lab 01/07/21 2148 01/08/21 0615  INR 1.0 0.9   COVID-19 Labs  No results for input(s): DDIMER, FERRITIN, LDH, CRP in the last 72 hours.  Lab Results  Component Value Date   SARSCOV2NAA NEGATIVE 01/08/2021    CBC: Recent Labs  Lab 01/07/21 2148 01/08/21 0615 01/10/21 0210 01/10/21 1424 01/11/21 0132 01/12/21 0158 01/13/21 0049 01/14/21 0234  WBC 15.8*   < > 12.8*  --  10.9* 13.1* 14.7* 16.0*  NEUTROABS 12.2*  --   --   --   --  9.0* 10.5*  --   HGB 7.2*   < > 8.8* 9.1* 8.7* 10.2* 9.5* 9.5*  HCT 22.6*   < > 26.3* 28.6* 27.2* 31.3* 28.7* 28.5*  MCV 92.6   < > 92.9  --  94.1 94.3 92.9 92.8  PLT 517*   < > 509*  --  466* 417* 397 374   < > = values in this interval not displayed.   Cardiac Enzymes: No results for input(s): CKTOTAL, CKMB, CKMBINDEX, TROPONINI in the last 168 hours. BNP (last 3 results) No results for input(s): PROBNP in the last 8760 hours. CBG: Recent Labs  Lab 01/13/21 0556 01/13/21 1203  01/13/21 1800 01/14/21 0002 01/14/21 0036  GLUCAP 105* 226* 190* 76 130*   D-Dimer: No results for input(s): DDIMER in the last 72 hours. Hgb A1c: No results for input(s): HGBA1C in the last 72 hours. Lipid Profile: No results for input(s): CHOL, HDL, LDLCALC, TRIG, CHOLHDL, LDLDIRECT in the last 72 hours. Thyroid function studies: No results for input(s): TSH, T4TOTAL, T3FREE, THYROIDAB in the last 72 hours.  Invalid input(s): FREET3 Anemia work up: No results for input(s): VITAMINB12, FOLATE, FERRITIN, TIBC, IRON, RETICCTPCT in the last 72 hours. Sepsis Labs: Recent Labs  Lab 01/07/21 2225 01/08/21 0615 01/11/21 0132 01/12/21  0158 01/13/21 0049 01/14/21 0234  WBC  --    < > 10.9* 13.1* 14.7* 16.0*  LATICACIDVEN 1.5  --   --   --   --   --    < > = values in this interval not displayed.   Microbiology Recent Results (from the past 240 hour(s))  SARS Coronavirus 2 by RT PCR (hospital order, performed in [facility] hospital lab) Nasopharyngeal Nasopharyngeal Swab     Status: None   Collection Time: 01/08/21  1:01 AM   Specimen: Nasopharyngeal Swab  Result Value Ref Range Status   SARS Coronavirus 2 NEGATIVE NEGATIVE Final    Comment: (NOTE) SARS-CoV-2 target nucleic acids are NOT DETECTED.  The SARS-CoV-2 RNA is generally detectable in upper and lower respiratory specimens during the acute phase of infection. The lowest concentration of SARS-CoV-2 viral copies this assay can detect is 250 copies / mL. A negative result does not preclude SARS-CoV-2 infection and should not be used as the sole basis for treatment or other patient management decisions.  A negative result may occur with improper specimen collection / handling, submission of specimen other than nasopharyngeal swab, presence of viral mutation(s) within the areas targeted by this assay, and inadequate number of viral copies (<250 copies / mL). A negative result must be combined with clinical observations,  patient history, and epidemiological information.  Fact Sheet for Patients:   BoilerBrush.com.cy  Fact Sheet for Healthcare Providers: https://pope.com/  This test is not yet approved or  cleared by the [place] FDA and has been authorized for detection and/or diagnosis of SARS-CoV-2 by FDA under an Emergency Use Authorization (EUA).  This EUA will remain in effect (meaning this test can be used) for the duration of the COVID-19 declaration under Section 564(b)(1) of the Act, 21 U.S.C. section 360bbb-3(b)(1), unless the authorization is terminated or revoked sooner.  Performed at [facility], [address] [place], [place] [address]   MRSA PCR Screening     Status: Abnormal   Collection Time: 01/08/21  6:37 AM   Specimen: Nasal Mucosa; Nasopharyngeal  Result Value Ref Range Status   MRSA by PCR POSITIVE (A) NEGATIVE Final    Comment:        The GeneXpert MRSA Assay (FDA approved for NASAL specimens only), is one component of a comprehensive MRSA colonization surveillance program. It is not intended to diagnose MRSA infection nor to guide or monitor treatment for MRSA infections. RESULT CALLED TO, READ BACK BY AND VERIFIED WITH: Alroy Bailiff RN 11:15 01/08/21 ([email or web address]) Performed at [facility], [address], [place], [place] [address]      Medications:   . feeding supplement  1 Container Oral TID BM  . mouth rinse  15 mL Mouth Rinse BID  . metoprolol succinate  25 mg Oral Daily  . pantoprazole  40 mg Oral Q0600  . polyethylene glycol  17 g Oral QODAY  . predniSONE  10 mg Oral Q breakfast   Continuous Infusions: . cefTRIAXone (ROCEPHIN)  IV 1 g (01/13/21 2129)  . metronidazole 500 mg (01/14/21 0538)      LOS: 7 days   Marinda Elk  [facility]  01/14/2021, 8:48 AM

## 2021-01-14 NOTE — Progress Notes (Signed)
Physical Therapy Treatment Patient Details Name: Joanne Hardin MRN: [id number] DOB: [birth year] Today's Date: 01/14/2021    History of Present Illness Pt is a 80 y.o. female with a PMH significant for HTN, hyperlipidemia, and recent COVID hospitalization now at [facility] facility for O2 weaning (on 40% FiO2) who presents with lower abdominal pain, bloody stools, decrease in hemoglobin, and GI bleed.    PT Comments    Pt progressing mobility to gt training this session.  Pt continues to benefit from skilled rehab in a post acute setting.  Proactively increased SPO2 to 5L [place].  Pt able to maintain sats 91% or greater.     Follow Up Recommendations  SNF;Supervision/Assistance - 24 hour     Equipment Recommendations   (defer to the next venue)    Recommendations for Other Services       Precautions / Restrictions Precautions Precautions: Fall Precaution Comments: monitor SpO2; HOH Restrictions Weight Bearing Restrictions: No    Mobility  Bed Mobility Overal bed mobility: Needs Assistance Bed Mobility: Supine to Sit     Supine to sit: Min assist;HOB elevated;+2 for safety/equipment     General bed mobility comments: extra time and effort. min A to come to full EOB position.  Transfers Overall transfer level: Needs assistance Equipment used: Rolling walker (2 wheeled) Transfers: Sit to/from Stand Sit to Stand: Mod assist;+2 safety/equipment         General transfer comment: assist to boost and steady from EOB. Cues for technique. did achieve min A +2 for one sit<>stand at sink from recliner.  Minor posterior LOB intially after standing edge of bed.  Ambulation/Gait Ambulation/Gait assistance: Mod assist;+2 physical assistance (close chair follow.) Gait Distance (Feet): 8 Feet Assistive device: Rolling walker (2 wheeled) Gait Pattern/deviations: Shuffle;Trunk flexed;Decreased stride length     General Gait Details: Cues for upper trunk control.  Assistance for pathway  of RW and cues for increasing stride length and posture.   Stairs             Wheelchair Mobility    Modified Rankin (Stroke Patients Only)       Balance Overall balance assessment: Needs assistance Sitting-balance support: Bilateral upper extremity supported;Feet supported Sitting balance-Leahy Scale: Fair Sitting balance - Comments: Able to maintain unsupported sitting position during oral care. Fatigues quickly       Standing balance comment: reliant on external support in standing, able to briefly stand with single extremity support during oral care with min A.                            Cognition Arousal/Alertness: Awake/alert Behavior During Therapy: WFL for tasks assessed/performed Overall Cognitive Status: Within Functional Limits for tasks assessed                                 General Comments: A&Ox4.      Exercises      General Comments        Pertinent Vitals/Pain Pain Assessment: No/denies pain    Home Living                      Prior Function            PT Goals (current goals can now be found in the care plan section) Acute Rehab PT Goals Patient Stated Goal: per son "to go to SNF and then be well  enough to go home." Potential to Achieve Goals: Fair Progress towards PT goals: Progressing toward goals    Frequency    Min 3X/week      PT Plan Current plan remains appropriate    Co-evaluation PT/OT/SLP Co-Evaluation/Treatment: Yes Reason for Co-Treatment: Complexity of the patient's impairments (multi-system involvement) PT goals addressed during session: Mobility/safety with mobility OT goals addressed during session: ADL's and self-care      AM-PAC PT "6 Clicks" Mobility   Outcome Measure  Help needed turning from your back to your side while in a flat bed without using bedrails?: A Little Help needed moving from lying on your back to sitting on the side of a flat bed without using  bedrails?: A Little Help needed moving to and from a bed to a chair (including a wheelchair)?: A Lot Help needed standing up from a chair using your arms (e.g., wheelchair or bedside chair)?: A Lot Help needed to walk in hospital room?: A Lot Help needed climbing 3-5 steps with a railing? : Total 6 Click Score: 13    End of Session Equipment Utilized During Treatment: Gait belt Activity Tolerance: Patient limited by fatigue;Treatment limited secondary to medical complications (Comment) Patient left: in chair;with call bell/phone within reach;with family/visitor present Nurse Communication: Mobility status (O2 levels and progression of mobility.) PT Visit Diagnosis: Muscle weakness (generalized) (M62.81);Difficulty in walking, not elsewhere classified (R26.2)     Time: 5638-7564 PT Time Calculation (min) (ACUTE ONLY): 23 min  Charges:  $Gait Training: 8-22 mins                     [name] , PTA Acute Rehabilitation Services Pager [phone] Office [phone]     Haruo Stepanek Artis Delay 01/14/2021, 5:53 PM

## 2021-01-14 NOTE — Plan of Care (Signed)

## 2021-01-15 LAB — CBC WITH DIFFERENTIAL/PLATELET
Abs Immature Granulocytes: 0.49 10*3/uL — ABNORMAL HIGH (ref 0.00–0.07)
Basophils Absolute: 0.1 10*3/uL (ref 0.0–0.1)
Basophils Relative: 1 %
Eosinophils Absolute: 0.2 10*3/uL (ref 0.0–0.5)
Eosinophils Relative: 2 %
HCT: 30.1 % — ABNORMAL LOW (ref 36.0–46.0)
Hemoglobin: 9.6 g/dL — ABNORMAL LOW (ref 12.0–15.0)
Immature Granulocytes: 4 %
Lymphocytes Relative: 11 %
Lymphs Abs: 1.5 10*3/uL (ref 0.7–4.0)
MCH: 30.2 pg (ref 26.0–34.0)
MCHC: 31.9 g/dL (ref 30.0–36.0)
MCV: 94.7 fL (ref 80.0–100.0)
Monocytes Absolute: 1.1 10*3/uL — ABNORMAL HIGH (ref 0.1–1.0)
Monocytes Relative: 8 %
Neutro Abs: 10.3 10*3/uL — ABNORMAL HIGH (ref 1.7–7.7)
Neutrophils Relative %: 74 %
Platelets: 322 10*3/uL (ref 150–400)
RBC: 3.18 MIL/uL — ABNORMAL LOW (ref 3.87–5.11)
RDW: 17.1 % — ABNORMAL HIGH (ref 11.5–15.5)
WBC: 13.8 10*3/uL — ABNORMAL HIGH (ref 4.0–10.5)
nRBC: 0.2 % (ref 0.0–0.2)

## 2021-01-15 LAB — GLUCOSE, CAPILLARY
Glucose-Capillary: 96 mg/dL (ref 70–99)
Glucose-Capillary: 170 mg/dL — ABNORMAL HIGH (ref 70–99)

## 2021-01-15 MED ORDER — ASPIRIN 81 MG PO CHEW: 81.0000 mg | CHEWABLE_TABLET | Freq: Every day | ORAL | Status: AC

## 2021-01-15 MED ORDER — METOPROLOL SUCCINATE ER 25 MG PO TB24: 50.0000 mg | ORAL_TABLET | Freq: Every day | ORAL | Status: AC

## 2021-01-15 MED ORDER — PREDNISONE 10 MG PO TABS: 5.0000 mg | ORAL_TABLET | Freq: Every day | ORAL | 0 refills | Status: AC

## 2021-01-15 MED ORDER — AMOXICILLIN-POT CLAVULANATE 875-125 MG PO TABS: 1.0000 | ORAL_TABLET | Freq: Three times a day (TID) | ORAL | 0 refills | Status: AC

## 2021-01-15 MED ORDER — PREDNISONE 5 MG PO TABS
5.0000 mg | ORAL_TABLET | Freq: Every day | ORAL | Status: DC
  Administered 2021-01-15: 5 mg via ORAL
  Filled 2021-01-15: qty 1

## 2021-01-15 MED ORDER — METOPROLOL SUCCINATE ER 50 MG PO TB24
50.0000 mg | ORAL_TABLET | Freq: Every day | ORAL | Status: DC
  Administered 2021-01-15: 50 mg via ORAL
  Filled 2021-01-15: qty 1

## 2021-01-15 NOTE — Care Management Important Message (Signed)
Important Message  Patient Details  Name: Joanne Hardin MRN: [id number] Date of Birth: 12/11/1941   Medicare Important Message Given:  Yes     Renie Ora 01/15/2021, 8:59 AM

## 2021-01-15 NOTE — Discharge Summary (Signed)
Physician Discharge Summary  Joanne Hardin RUE:[id number] DOB: [birth year] DOA: 01/07/2021  PCP: [facility]  Admit date: 01/07/2021 Discharge date: 01/15/2021  [facility] From: [facility]  Disposition: Skilled nursing facility   Recommendations for Outpatient Follow-up:  1. Follow up with GI in 2 weeks for colonoscopy. 2. Please obtain BMP/CBC in one week  Home Health: No Equipment/Devices: None  Discharge Condition: Stable CODE STATUS: Full Diet recommendation: Heart Healthy    Brief/Interim Summary: 80 y.o. female essential hypertension and hyperlipidemia admitted on December 10, 2020 for COVID-19 infection for which she was difficult to wean off oxygen and subsequently discharged to [facility] comes back into the hospital for melanotic stool to start her on 01/07/2021.  CT scan of the abdomen pelvis showed diverticulitis and lower stool burden.  We will start empirically on antibiotics transfusion and disimpaction. Gastroenterology was consulted and they relate GI bleed was probably due to steroids.  Requiring the limited colonoscopy rectosigmoid prior to discharge.  Blood preferred to do the procedure after the patient respiratory status has returned to baseline.  We will try to wean him off the oxygen.  Discharge Diagnoses:  Principal Problem:   Acute GI bleeding Active Problems:   Acute respiratory failure due to COVID-19 [facility])   Essential hypertension   Acute blood loss anemia   Hematochezia   Slow transit constipation   Diverticulitis of colon   Lower GI bleed   Fecal impaction (HCC)   Abnormal CT of the abdomen  Acute GI bleed/acute blood loss anemia: In the setting of diverticular bleed versus acute diverticulitis. She was transfused 1 unit of packed red blood cells GI was consulted recommended a flex sig as an outpatient once her respiratory status improved. Her hemoglobin remained stable physical therapy evaluated the patient recommended skilled nursing  facility.  Acute diverticulitis: With a CT scan on 01/08/2021 that showed inflammatory stranding along the sigmoid colon with a 2.4 cm focal inflammatory mass. She was febrile she was started empirically on IV Rocephin and Flagyl. Her white count started to improve. She was changed to oral Augmentin which should continue as an outpatient for a total of 14 days. He is to follow-up with GI as an outpatient for a flex sig when she is off the oxygen.  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 Try to wean her as an outpatient to room air. Her oxygen requirements in the hospital have not increased. We will continue to taper down her steroids will continue prednisone for 2 additional days and outpatient.  Essential hypertension: Her metoprolol was increased as she was mildly tachycardic otherwise her blood pressure has remained stable no further changes to her medication.   Discharge Instructions  Discharge Instructions    Diet - low sodium heart healthy   Complete by: As directed    Increase activity slowly   Complete by: As directed      Allergies as of 01/15/2021   No Known Allergies     Medication List    STOP taking these medications   ALPRAZolam 0.25 MG tablet Commonly known as: XANAX   benzonatate 100 MG capsule Commonly known as: TESSALON   enoxaparin 80 MG/0.8ML injection Commonly known as: LOVENOX   guaifenesin 400 MG Tabs tablet Commonly known as: HUMIBID E   methylPREDNISolone 16 MG tablet Commonly known as: MEDROL   multivitamin tablet  ondansetron 4 MG tablet Commonly known as: ZOFRAN   thiamine 100 MG tablet   vitamin C 500 MG tablet Commonly known as: ASCORBIC ACID   zinc sulfate 220 (50 Zn) MG capsule     TAKE these medications   acetaminophen 650 MG CR tablet Commonly known as: TYLENOL Take  650 mg by mouth every 6 (six) hours as needed for pain.   amLODipine 10 MG tablet Commonly known as: NORVASC Take 10 mg by mouth daily.   amoxicillin-clavulanate 875-125 MG tablet Commonly known as: Augmentin Take 1 tablet by mouth 3 (three) times daily for 7 days.   aspirin 81 MG chewable tablet Chew 1 tablet (81 mg total) by mouth daily. Start taking on: January 18, 2021 What changed: These instructions start on January 18, 2021. If you are unsure what to do until then, ask your doctor or other care provider.   docusate sodium 100 MG capsule Commonly known as: COLACE Take 100 mg by mouth in the morning and at bedtime.   ENSURE ENLIVE PO Take 237 mLs by mouth 2 (two) times daily with a meal.   LACTOBACILLUS PO Take 1 tablet by mouth in the morning and at bedtime.   losartan 100 MG tablet Commonly known as: COZAAR Take 100 mg by mouth daily.   melatonin 3 MG Tabs tablet Take 3 mg by mouth at bedtime.   metoprolol succinate 25 MG 24 hr tablet Commonly known as: TOPROL-XL Take 2 tablets (50 mg total) by mouth daily. What changed: how much to take   pantoprazole 40 MG tablet Commonly known as: PROTONIX Take 40 mg by mouth 2 (two) times daily.   predniSONE 10 MG tablet Commonly known as: DELTASONE Take 0.5 tablets (5 mg total) by mouth daily. What changed:   how much to take  when to take this  additional instructions   sennosides-docusate sodium 8.6-50 MG tablet Commonly known as: SENOKOT-S Take 2 tablets by mouth daily.   tamsulosin 0.4 MG Caps capsule Commonly known as: FLOMAX Take 0.4 mg by mouth daily.   Vitamin D 125 MCG (5000 UT) Caps Take 5,000 Units by mouth daily.       No Known Allergies  Consultations:  Gastroenterology   Procedures/Studies: CT ABDOMEN PELVIS W CONTRAST  Result Date: 01/08/2021 CLINICAL DATA:  Left lower quadrant pain EXAM: CT ABDOMEN AND PELVIS WITH CONTRAST TECHNIQUE: Multidetector CT imaging of the abdomen and  pelvis was performed using the standard protocol following bolus administration of intravenous contrast. CONTRAST:  OMNIPAQUE IOHEXOL 300 MG/ML  SOLN COMPARISON:  None. FINDINGS: Lower chest: Lung bases demonstrate small left greater than right pleural effusions. Irregular consolidations and ground-glass densities suspicious for bilateral pneumonia. Hepatobiliary: Subcentimeter hypodensity within the anterior left hepatic lobe too small to further characterize. No calcified gallstone or biliary dilatation Pancreas: Unremarkable. No pancreatic ductal dilatation or surrounding inflammatory changes. Spleen: Normal in size without focal abnormality. Adrenals/Urinary Tract: Adrenal glands are normal. Kidneys show no hydronephrosis. Nonspecific left perinephric fluid. The bladder is decompressed by Foley catheter. Stomach/Bowel: The stomach is nonenlarged. There is no dilated small bowel. There is large amount of stool in the colon. There is moderate rectal distension by feces. Mild soft tissue stranding at the sigmoid colon with diverticula present suspicious for diverticulitis. Focal inflammatory soft tissue density adjacent to the sigmoid colon measuring 2.4 x 2.3 cm with small internal gas bubble. Vascular/Lymphatic: Nonaneurysmal aorta. Mild aortic atherosclerosis. No suspicious adenopathy Reproductive: Uterus and adnexa are unremarkable Other: No free air.  Mild presacral soft tissue stranding. Musculoskeletal: Probable chronic mild superior endplate deformity at T10 IMPRESSION: 1. Inflammatory soft tissue stranding and mild wall thickening at the sigmoid colon, consistent with acute diverticulitis. 2.4 cm focal inflammatory mass adjacent to sigmoid colon with tiny gas bubble, this could represent contained perforation, phlegmon/developing abscess, or inflamed giant diverticulum given sizable diverticula elsewhere in the sigmoid colon. Follow-up colonoscopy recommended to exclude a mass after resolution of  acute presentation. 2. Large amount of stool in the colon with moderate rectal distension by feces. 3. Small left greater than right pleural effusions. Irregular consolidations and ground-glass densities at the lung bases suspicious for bilateral pneumonia, possible atypical or viral pneumonia. These results will be called to the ordering clinician or representative by the Radiologist Assistant, and communication documented in the PACS or [employer]. Aortic Atherosclerosis (ICD10-I70.0). Electronically Signed   By: Jasmine Pang M.D.   On: 01/08/2021 19:14     Subjective: No new complaints  Discharge Exam: Vitals:   01/15/21 0413 01/15/21 0731  BP: (!) 145/66 (!) 141/75  Pulse: (!) 110 100  Resp: 20 20  Temp: 99 F (37.2 C) 98.2 F (36.8 C)  SpO2: 95% 97%   Vitals:   01/14/21 1938 01/14/21 2347 01/15/21 0413 01/15/21 0731  BP: (!) 156/92 (!) 146/64 (!) 145/66 (!) 141/75  Pulse: (!) 103 (!) 107 (!) 110 100  Resp: 20 20 20 20   Temp: 99.1 F (37.3 C) 97.7 F (36.5 C) 99 F (37.2 C) 98.2 F (36.8 C)  TempSrc: Oral Oral Oral Oral  SpO2: 95% 96% 95% 97%  Weight:      Height:        General: Pt is alert, awake, not in acute distress Cardiovascular: RRR, S1/S2 +, no rubs, no gallops Respiratory: CTA bilaterally, no wheezing, no rhonchi Abdominal: Soft, NT, ND, bowel sounds + Extremities: no edema, no cyanosis    The results of significant diagnostics from this hospitalization (including imaging, microbiology, ancillary and laboratory) are listed below for reference.     Microbiology: Recent Results (from the past 240 hour(s))  SARS Coronavirus 2 by RT PCR (hospital order, performed in [facility] hospital lab) Nasopharyngeal Nasopharyngeal Swab     Status: None   Collection Time: 01/08/21  1:01 AM   Specimen: Nasopharyngeal Swab  Result Value Ref Range Status   SARS Coronavirus 2 NEGATIVE NEGATIVE Final    Comment: (NOTE) SARS-CoV-2 target nucleic acids are NOT  DETECTED.  The SARS-CoV-2 RNA is generally detectable in upper and lower respiratory specimens during the acute phase of infection. The lowest concentration of SARS-CoV-2 viral copies this assay can detect is 250 copies / mL. A negative result does not preclude SARS-CoV-2 infection and should not be used as the sole basis for treatment or other patient management decisions.  A negative result may occur with improper specimen collection / handling, submission of specimen other than nasopharyngeal swab, presence of viral mutation(s) within the areas targeted by this assay, and inadequate number of viral copies (<250 copies / mL). A negative result must be combined with clinical observations, patient history, and epidemiological information.  Fact Sheet for Patients:   01/10/21  Fact Sheet for Healthcare Providers: BoilerBrush.com.cy  This test is not yet approved or  cleared by the https://pope.com/ FDA and has been authorized for detection and/or diagnosis of SARS-CoV-2 by FDA under an Emergency Use Authorization (EUA).  This EUA will remain in effect (meaning this test can be used) for the duration of  the COVID-19 declaration under Section 564(b)(1) of the Act, 21 U.S.C. section 360bbb-3(b)(1), unless the authorization is terminated or revoked sooner.  Performed at [facility], [address] [place], [place] [address]   MRSA PCR Screening     Status: Abnormal   Collection Time: 01/08/21  6:37 AM   Specimen: Nasal Mucosa; Nasopharyngeal  Result Value Ref Range Status   MRSA by PCR POSITIVE (A) NEGATIVE Final    Comment:        The GeneXpert MRSA Assay (FDA approved for NASAL specimens only), is one component of a comprehensive MRSA colonization surveillance program. It is not intended to diagnose MRSA infection nor to guide or monitor treatment for MRSA infections. RESULT CALLED TO, READ BACK BY AND VERIFIED  WITH: Alroy Bailiff RN 11:15 01/08/21 ([email or web address]) Performed at [facility], [address] [place], [place] [address]   SARS CORONAVIRUS 2 (TAT 6-24 HRS) Nasopharyngeal Nasopharyngeal Swab     Status: None   Collection Time: 01/14/21 10:37 AM   Specimen: Nasopharyngeal Swab  Result Value Ref Range Status   SARS Coronavirus 2 NEGATIVE NEGATIVE Final    Comment: (NOTE) SARS-CoV-2 target nucleic acids are NOT DETECTED.  The SARS-CoV-2 RNA is generally detectable in upper and lower respiratory specimens during the acute phase of infection. Negative results do not preclude SARS-CoV-2 infection, do not rule out co-infections with other pathogens, and should not be used as the sole basis for treatment or other patient management decisions. Negative results must be combined with clinical observations, patient history, and epidemiological information. The expected result is Negative.  Fact Sheet for Patients: HairSlick.no  Fact Sheet for Healthcare Providers: quierodirigir.com  This test is not yet approved or cleared by the [place] FDA and  has been authorized for detection and/or diagnosis of SARS-CoV-2 by FDA under an Emergency Use Authorization (EUA). This EUA will remain  in effect (meaning this test can be used) for the duration of the COVID-19 declaration under Se ction 564(b)(1) of the Act, 21 U.S.C. section 360bbb-3(b)(1), unless the authorization is terminated or revoked sooner.  Performed at [facility], [address], [place], [place] [address]      Labs: BNP (last 3 results) No results for input(s): BNP in the last 8760 hours. Basic Metabolic Panel: Recent Labs  Lab 01/09/21 1600 01/10/21 0210 01/11/21 0132 01/12/21 0158  NA 135 136 136 136  K 4.5 3.5 3.2* 3.8  CL 99 102 101 99  CO2 28 25 28 27   GLUCOSE 147* 85 96 82  BUN 10 12 9  5*  CREATININE 0.50 0.53 0.40* 0.42*  CALCIUM 8.8* 8.6* 8.3*  8.6*   Liver Function Tests: No results for input(s): AST, ALT, ALKPHOS, BILITOT, PROT, ALBUMIN in the last 168 hours. No results for input(s): LIPASE, AMYLASE in the last 168 hours. No results for input(s): AMMONIA in the last 168 hours. CBC: Recent Labs  Lab 01/11/21 0132 01/12/21 0158 01/13/21 0049 01/14/21 0234 01/15/21 0221  WBC 10.9* 13.1* 14.7* 16.0* 13.8*  NEUTROABS  --  9.0* 10.5*  --  10.3*  HGB 8.7* 10.2* 9.5* 9.5* 9.6*  HCT 27.2* 31.3* 28.7* 28.5* 30.1*  MCV 94.1 94.3 92.9 92.8 94.7  PLT 466* 417* 397 374 322   Cardiac Enzymes: No results for input(s): CKTOTAL, CKMB, CKMBINDEX, TROPONINI in the last 168 hours. BNP: Invalid input(s): POCBNP CBG: Recent Labs  Lab 01/14/21 0036 01/14/21 1200 01/14/21 1549 01/14/21 2349 01/15/21 0611  GLUCAP 130* 104*  118* 140* 96   D-Dimer No results for input(s): DDIMER in the last 72 hours. Hgb A1c No results for input(s): HGBA1C in the last 72 hours. Lipid Profile No results for input(s): CHOL, HDL, LDLCALC, TRIG, CHOLHDL, LDLDIRECT in the last 72 hours. Thyroid function studies No results for input(s): TSH, T4TOTAL, T3FREE, THYROIDAB in the last 72 hours.  Invalid input(s): FREET3 Anemia work up No results for input(s): VITAMINB12, FOLATE, FERRITIN, TIBC, IRON, RETICCTPCT in the last 72 hours. Urinalysis No results found for: COLORURINE, APPEARANCEUR, LABSPEC, PHURINE, GLUCOSEU, HGBUR, BILIRUBINUR, KETONESUR, PROTEINUR, UROBILINOGEN, NITRITE, LEUKOCYTESUR Sepsis Labs Invalid input(s): PROCALCITONIN,  WBC,  LACTICIDVEN Microbiology Recent Results (from the past 240 hour(s))  SARS Coronavirus 2 by RT PCR (hospital order, performed in [facility] hospital lab) Nasopharyngeal Nasopharyngeal Swab     Status: None   Collection Time: 01/08/21  1:01 AM   Specimen: Nasopharyngeal Swab  Result Value Ref Range Status   SARS Coronavirus 2 NEGATIVE NEGATIVE Final    Comment: (NOTE) SARS-CoV-2 target nucleic acids are NOT  DETECTED.  The SARS-CoV-2 RNA is generally detectable in upper and lower respiratory specimens during the acute phase of infection. The lowest concentration of SARS-CoV-2 viral copies this assay can detect is 250 copies / mL. A negative result does not preclude SARS-CoV-2 infection and should not be used as the sole basis for treatment or other patient management decisions.  A negative result may occur with improper specimen collection / handling, submission of specimen other than nasopharyngeal swab, presence of viral mutation(s) within the areas targeted by this assay, and inadequate number of viral copies (<250 copies / mL). A negative result must be combined with clinical observations, patient history, and epidemiological information.  Fact Sheet for Patients:   BoilerBrush.com.cyhttps://www.fda.gov/media/136312/download  Fact Sheet for Healthcare Providers: https://pope.com/https://www.fda.gov/media/136313/download  This test is not yet approved or  cleared by the [place] FDA and has been authorized for detection and/or diagnosis of SARS-CoV-2 by FDA under an Emergency Use Authorization (EUA).  This EUA will remain in effect (meaning this test can be used) for the duration of the COVID-19 declaration under Section 564(b)(1) of the Act, 21 U.S.C. section 360bbb-3(b)(1), unless the authorization is terminated or revoked sooner.  Performed at [facility], [address] [place], [place] [address]   MRSA PCR Screening     Status: Abnormal   Collection Time: 01/08/21  6:37 AM   Specimen: Nasal Mucosa; Nasopharyngeal  Result Value Ref Range Status   MRSA by PCR POSITIVE (A) NEGATIVE Final    Comment:        The GeneXpert MRSA Assay (FDA approved for NASAL specimens only), is one component of a comprehensive MRSA colonization surveillance program. It is not intended to diagnose MRSA infection nor to guide or monitor treatment for MRSA infections. RESULT CALLED TO, READ BACK BY AND VERIFIED  WITH: Alroy Bailiff. Harris RN 11:15 01/08/21 ([email or web address]) Performed at [facility], [address] [place], [place] [address]   SARS CORONAVIRUS 2 (TAT 6-24 HRS) Nasopharyngeal Nasopharyngeal Swab     Status: None   Collection Time: 01/14/21 10:37 AM   Specimen: Nasopharyngeal Swab  Result Value Ref Range Status   SARS Coronavirus 2 NEGATIVE NEGATIVE Final    Comment: (NOTE) SARS-CoV-2 target nucleic acids are NOT DETECTED.  The SARS-CoV-2 RNA is generally detectable in upper and lower respiratory specimens during the acute phase of infection. Negative results do not preclude SARS-CoV-2 infection, do not rule out co-infections with other pathogens,  and should not be used as the sole basis for treatment or other patient management decisions. Negative results must be combined with clinical observations, patient history, and epidemiological information. The expected result is Negative.  Fact Sheet for Patients: HairSlick.no  Fact Sheet for Healthcare Providers: quierodirigir.com  This test is not yet approved or cleared by the [place] FDA and  has been authorized for detection and/or diagnosis of SARS-CoV-2 by FDA under an Emergency Use Authorization (EUA). This EUA will remain  in effect (meaning this test can be used) for the duration of the COVID-19 declaration under Se ction 564(b)(1) of the Act, 21 U.S.C. section 360bbb-3(b)(1), unless the authorization is terminated or revoked sooner.  Performed at [facility], [address], [place], [place] [address]      Time coordinating discharge: Over 30 minutes  SIGNED:   Marinda Elk, MD  [facility] 01/15/2021, 9:08 AM Pager   If 7PM-7AM, please contact night-coverage [email or web address] Password [email or web address]

## 2021-01-15 NOTE — Progress Notes (Signed)
Physical Therapy Treatment Patient Details Name: Joanne Hardin MRN: [id number] DOB: 11-30-1941 Today's Date: 01/15/2021    History of Present Illness Pt is a 80 y.o. female with a PMH significant for HTN, hyperlipidemia, and recent COVID hospitalization now at [facility] facility for O2 weaning (on 40% FiO2) who presents with lower abdominal pain, bloody stools, decrease in hemoglobin, and GI bleed.    PT Comments    Pt supine in bed on arrival this session.  Required cues for encouragement but ultimately agreeable to PT session at this time.  Pt continues to benefit from skilled rehab as she continues to require min to mod assistance but showing improvement.  Progressed gt to 30 ft this session.     Follow Up Recommendations  SNF;Supervision/Assistance - 24 hour     Equipment Recommendations   (defer to the next venue)    Recommendations for Other Services       Precautions / Restrictions Precautions Precautions: Fall Precaution Comments: monitor SpO2; HOH Restrictions Weight Bearing Restrictions: No    Mobility  Bed Mobility Overal bed mobility: Needs Assistance Bed Mobility: Supine to Sit;Sit to Supine     Supine to sit: Min assist     General bed mobility comments: Min assistance to move into sitting. Once in sitting able to follow commands to scoot independently.  Transfers Overall transfer level: Needs assistance Equipment used: Rolling walker (2 wheeled) Transfers: Sit to/from Stand Sit to Stand: Mod assist;+2 safety/equipment         General transfer comment: Cues for hand placement to boost into standing.  Hand over hand placement to reach back for recliner post gt training.  Ambulation/Gait Ambulation/Gait assistance: Mod assist;+2 safety/equipment (close chair follow.) Gait Distance (Feet): 30 Feet Assistive device: Rolling walker (2 wheeled) Gait Pattern/deviations: Shuffle;Trunk flexed;Decreased stride length     General Gait Details: Cues for  upper trunk control.  Assistance for pathway of RW and cues for increasing stride length and posture.   Stairs             Wheelchair Mobility    Modified Rankin (Stroke Patients Only)       Balance Overall balance assessment: Needs assistance Sitting-balance support: Bilateral upper extremity supported;Feet supported Sitting balance-Leahy Scale: Fair Sitting balance - Comments: Able to maintain unsupported sitting position during oral care. Fatigues quickly     Standing balance-Leahy Scale: Poor                              Cognition Arousal/Alertness: Awake/alert Behavior During Therapy: WFL for tasks assessed/performed Overall Cognitive Status: Within Functional Limits for tasks assessed                                 General Comments: extremely HOH      Exercises      General Comments        Pertinent Vitals/Pain Pain Assessment: No/denies pain    Home Living                      Prior Function            PT Goals (current goals can now be found in the care plan section) Acute Rehab PT Goals Patient Stated Goal: per son "to go to SNF and then be well enough to go home." Potential to Achieve Goals: Fair Progress towards PT goals: Progressing  toward goals    Frequency    Min 3X/week      PT Plan Current plan remains appropriate    Co-evaluation              AM-PAC PT "6 Clicks" Mobility   Outcome Measure  Help needed turning from your back to your side while in a flat bed without using bedrails?: A Little Help needed moving from lying on your back to sitting on the side of a flat bed without using bedrails?: A Little Help needed moving to and from a bed to a chair (including a wheelchair)?: A Lot Help needed standing up from a chair using your arms (e.g., wheelchair or bedside chair)?: A Lot Help needed to walk in hospital room?: A Lot Help needed climbing 3-5 steps with a railing? : Total 6  Click Score: 13    End of Session Equipment Utilized During Treatment: Gait belt Activity Tolerance: Patient limited by fatigue;Treatment limited secondary to medical complications (Comment) Patient left: in chair;with call bell/phone within reach;with family/visitor present Nurse Communication: Mobility status PT Visit Diagnosis: Muscle weakness (generalized) (M62.81);Difficulty in walking, not elsewhere classified (R26.2)     Time: 6837-2902 PT Time Calculation (min) (ACUTE ONLY): 25 min  Charges:  $Gait Training: 8-22 mins $Therapeutic Activity: 8-22 mins                     [name] , PTA Acute Rehabilitation Services Pager [phone] Office [phone]     Catalino Plascencia Artis Delay 01/15/2021, 12:52 PM

## 2021-01-15 NOTE — TOC Transition Note (Signed)
Transition of Care [facility]) - CM/SW Discharge Note   Patient Details  Name: Joanne Hardin MRN: [id number] Date of Birth: 12-27-1940  Transition of Care [facility] CM/SW Contact:  [name], [place] Phone Number: 01/15/2021, 11:04 AM   Clinical Narrative:     Patient will Discharge to: [name]) Discharge Date: 01/15/21 Family Notified: [name],son Transport By: [name] [place] 1:30- 2pm  Per MD patient is ready for discharge. RN, patient, and facility notified of DC. Discharge Summary and covid test results sent to facility. RN given number for report([phone]). Ambulance transport requested for patient.   Clinical Social Worker signing off.  [name], MSW, LCSW Clinical Social Worker    Final next level of care: Skilled Nursing Facility Barriers to Discharge: Barriers Resolved   Patient Goals and CMS Choice Patient states their goals for this hospitalization and ongoing recovery are:: rehab/LTACH CMS Medicare.gov Compare Post Acute Care list provided to:: Patient Represenative (must comment) Choice offered to / list presented to : Adult Children  Discharge Placement              Patient chooses bed at:  [name] [place]) Patient to be transferred to facility by: [facility] Name of family member notified: [name], son Patient and family notified of of transfer: 01/15/21  Discharge Plan and Services   Discharge Planning Services: CM Consult Post Acute Care Choice: Long Term Acute Care (LTAC)                               Social Determinants of Health (SDOH) Interventions     Readmission Risk Interventions No flowsheet data found.

## 2021-01-15 NOTE — TOC Progression Note (Signed)
Transition of Care [facility]) - Progression Note    Patient Details  Name: Nykole Matos MRN: [id number] Date of Birth: 04-12-1941  Transition of [facility]) CM/SW Contact  [name], [place] Phone Number: 01/15/2021, 10:08 AM  Clinical Narrative:     CSW  Informed by [facility] they are unable to transport patient until tomorrow @ 11am, depending on the weather.  CSW faxed d/c summary and covid test results to SNF. CSW informed SNF of anticipated d/c tomorrow at 11am,depending on weather.   MD & Charge RN updated   CSW will continue to follow and assist with discharge planning.   [name], MSW, LCSW Clinical Social Worker   Expected Discharge Plan: Skilled Nursing Facility Barriers to Discharge: Continued Medical Work up,SNF Pending bed offer  Expected Discharge Plan and Services Expected Discharge Plan: Skilled Nursing Facility   Discharge Planning Services: CM Consult Post Acute Care Choice: Long Term Acute Care (LTAC) Living arrangements for the past 2 months: Post-Acute Facility Expected Discharge Date: 01/15/21                                     Social Determinants of Health (SDOH) Interventions    Readmission Risk Interventions No flowsheet data found.

## 2021-01-15 NOTE — Progress Notes (Signed)
Patient discharged to [facility] with transport. IV removed. Discharge packet sent with transport. Report called to receiving nurse, Vanice Sarah, at facility.

## 2022-11-02 IMAGING — CT CT ABD-PELV W/ CM
2 of 5 series · 16 of 46 positions shown, 18 images · IV contrast (Omni 300)
Comparison: None.

CLINICAL DATA: Left lower quadrant pain

EXAM:
CT ABDOMEN AND PELVIS WITH CONTRAST
TECHNIQUE: Multidetector CT imaging of the abdomen and pelvis was performed
using the standard protocol following bolus administration of
intravenous contrast.
CONTRAST:  100mL OMNIPAQUE IOHEXOL 300 MG/ML  SOLN

[Series 3: a/p w/ 5mm · axial · 0.83mm/px · z∈[+1032,+1482]mm · 13 of 102 slices shown, 15 images]
[im 6/102  soft-tissue]
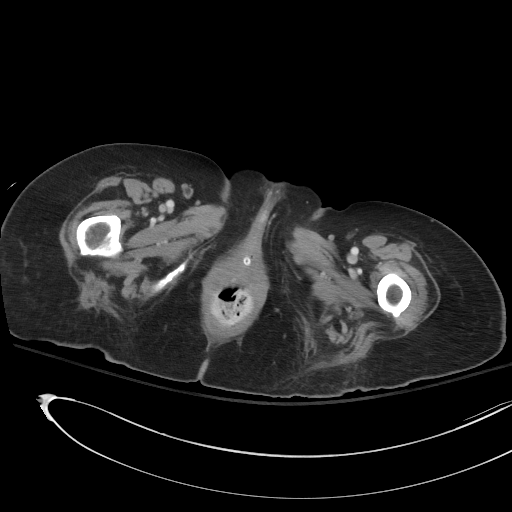
[im 6/102  bone]
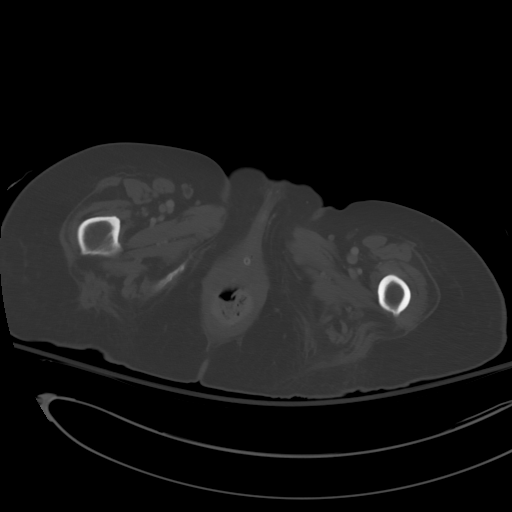
[im 16/102  soft-tissue]
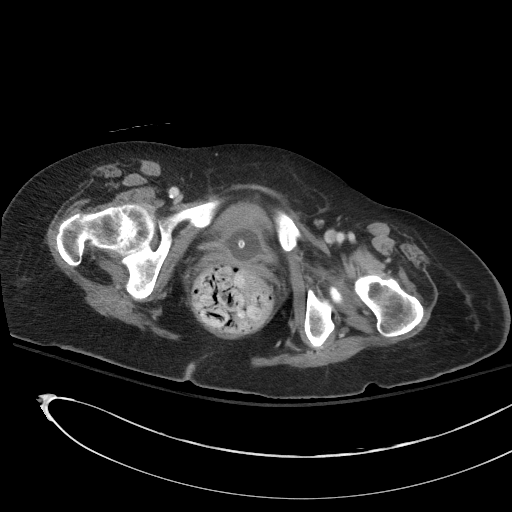
[im 22/102  soft-tissue]
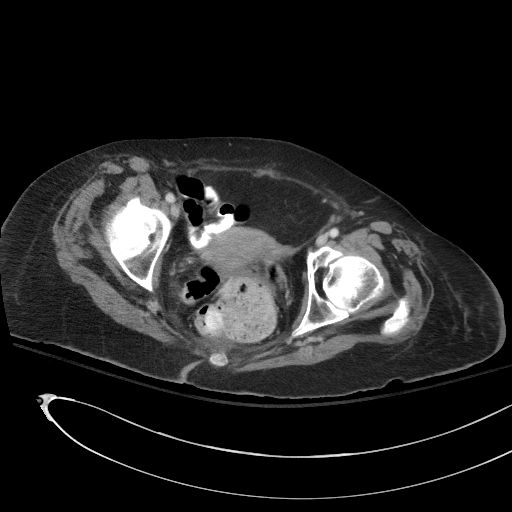
[im 27/102  soft-tissue]
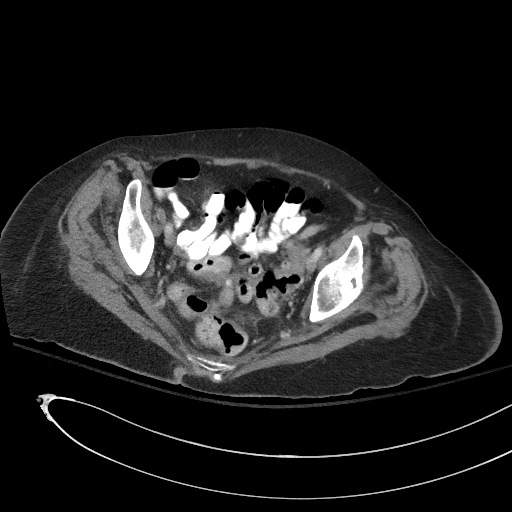
[im 38/102  soft-tissue]
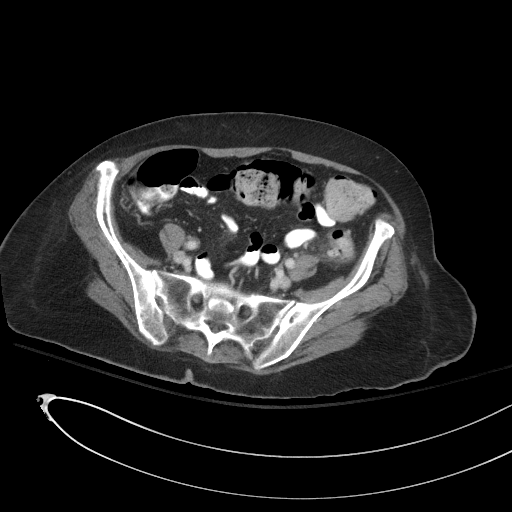
[im 43/102  soft-tissue]
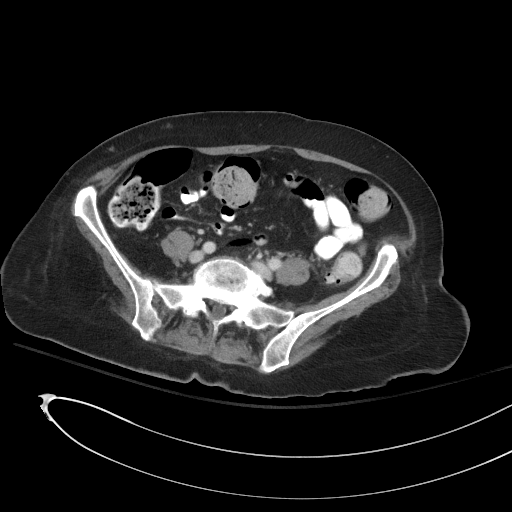
[im 54/102  soft-tissue]
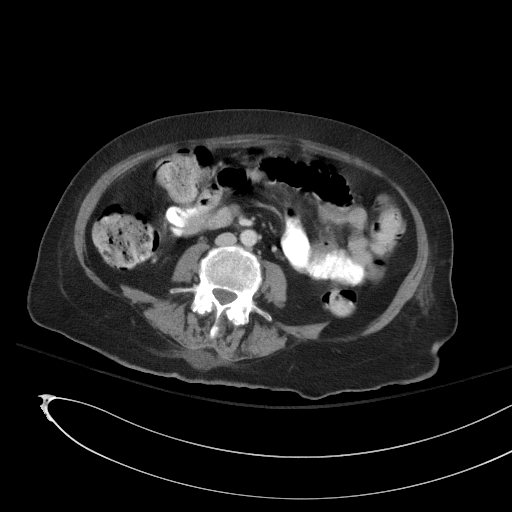
[im 59/102  soft-tissue]
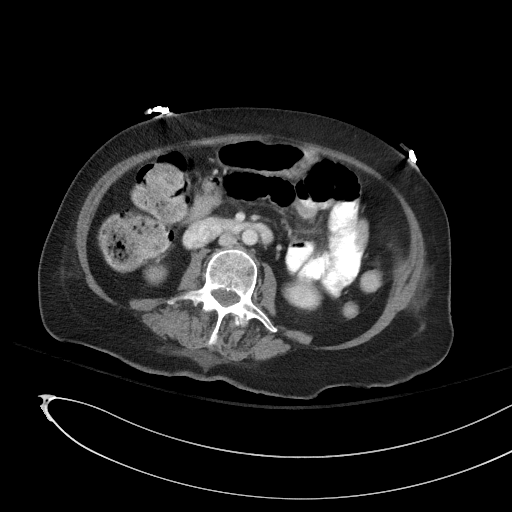
[im 64/102  soft-tissue]
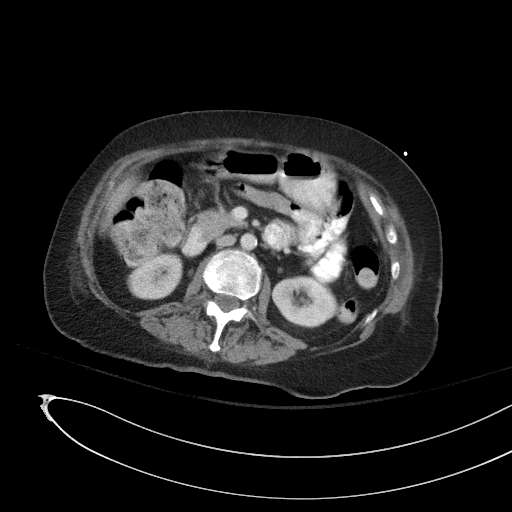
[im 64/102  bone]
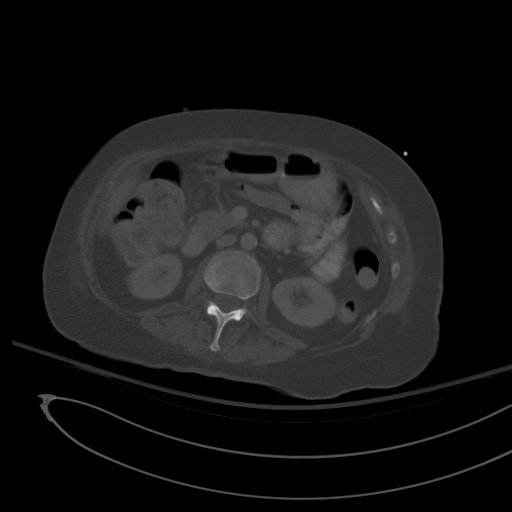
[im 75/102  soft-tissue]
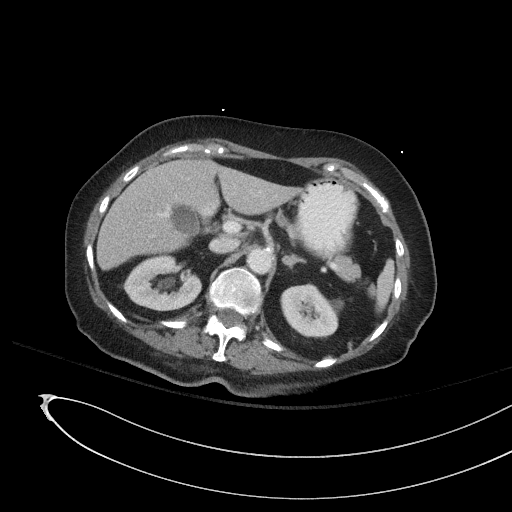
[im 80/102  soft-tissue]
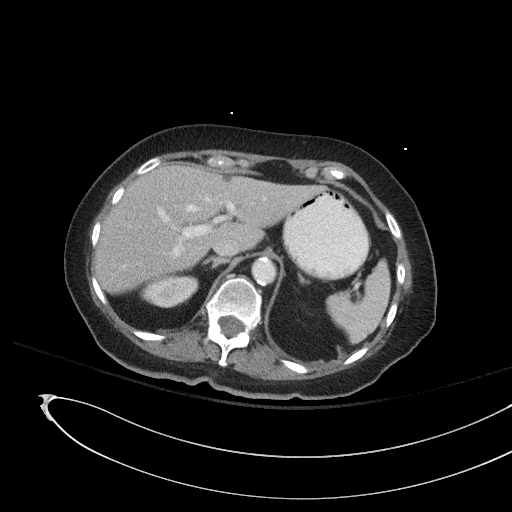
[im 86/102  soft-tissue]
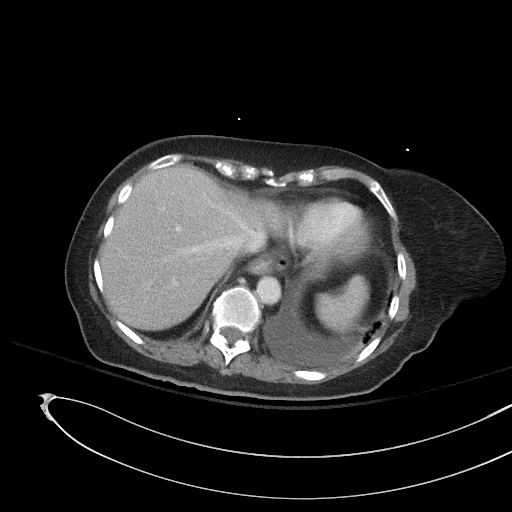
[im 96/102  soft-tissue]
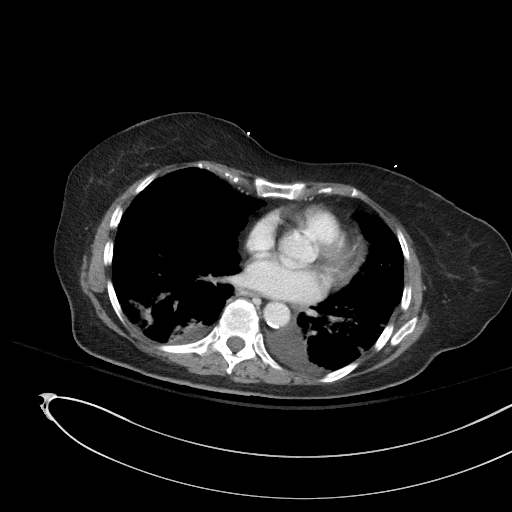

[Series 6: a/p w/ cor · coronal · 1.02mm/px · 3 of 148 slices shown]
[im 50/148  soft-tissue]
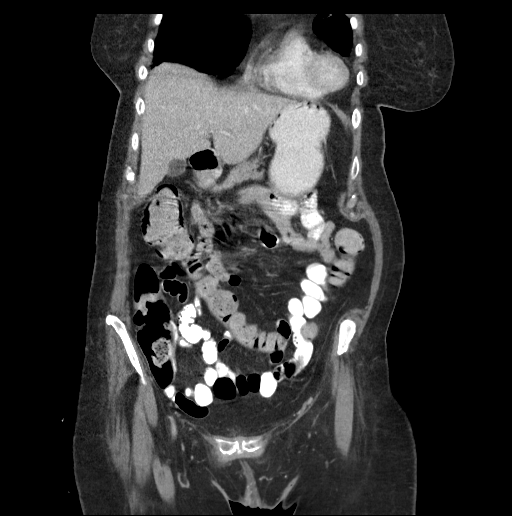
[im 66/148  soft-tissue]
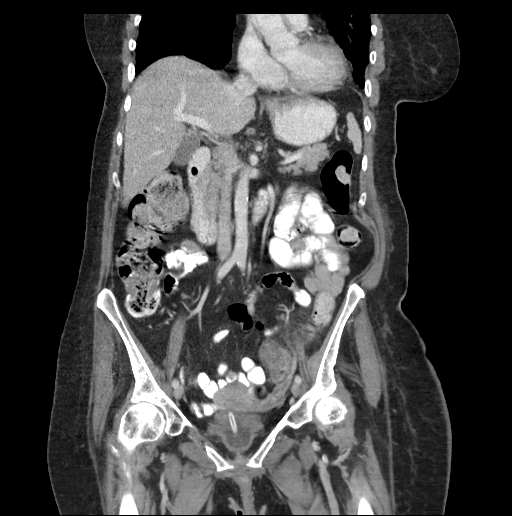
[im 82/148  soft-tissue]
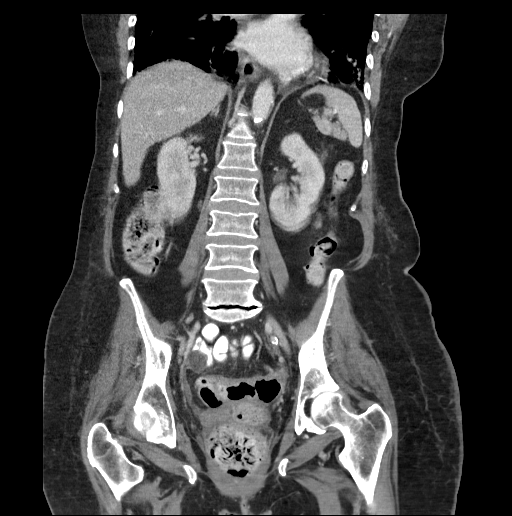

[16 of 46 positions shown; findings below may reference images not displayed]

FINDINGS: Lower chest: Lung bases demonstrate small left greater than right
pleural effusions. Irregular consolidations and ground-glass
densities suspicious for bilateral pneumonia.

Hepatobiliary: Subcentimeter hypodensity within the anterior left
hepatic lobe too small to further characterize. No calcified
gallstone or biliary dilatation

Pancreas: Unremarkable. No pancreatic ductal dilatation or
surrounding inflammatory changes.

Spleen: Normal in size without focal abnormality.

Adrenals/Urinary Tract: Adrenal glands are normal. Kidneys show no
hydronephrosis. Nonspecific left perinephric fluid. The bladder is
decompressed by Foley catheter.

Stomach/Bowel: The stomach is nonenlarged. There is no dilated small
bowel. There is large amount of stool in the colon. There is
moderate rectal distension by feces. Mild soft tissue stranding at
the sigmoid colon with diverticula present suspicious for
diverticulitis. Focal inflammatory soft tissue density adjacent to
the sigmoid colon measuring 2.4 x 2.3 cm with small internal gas
bubble.

Vascular/Lymphatic: Nonaneurysmal aorta. Mild aortic
atherosclerosis. No suspicious adenopathy

Reproductive: Uterus and adnexa are unremarkable

Other: No free air.  Mild presacral soft tissue stranding.

Musculoskeletal: Probable chronic mild superior endplate deformity
at T10
IMPRESSION: 1. Inflammatory soft tissue stranding and mild wall thickening at
the sigmoid colon, consistent with acute diverticulitis. 2.4 cm
focal inflammatory mass adjacent to sigmoid colon with tiny gas
bubble, this could represent contained perforation,
phlegmon/developing abscess, or inflamed giant diverticulum given
sizable diverticula elsewhere in the sigmoid colon. Follow-up
colonoscopy recommended to exclude a mass after resolution of acute
presentation.
2. Large amount of stool in the colon with moderate rectal
distension by feces.
3. Small left greater than right pleural effusions. Irregular
consolidations and ground-glass densities at the lung bases
suspicious for bilateral pneumonia, possible atypical or viral
pneumonia.

These results will be called to the ordering clinician or
representative by the Radiologist Assistant, and communication
documented in the PACS or [REDACTED].

Aortic Atherosclerosis ([id number]-S90.0).
